# Patient Record
Sex: Male | Born: 1955 | Race: White | Hispanic: No | Marital: Single | Smoking: Never smoker
Health system: Southern US, Community
[De-identification: ages and names within clinical notes are randomized; demographics above are authoritative.]

## PROBLEM LIST (undated history)

## (undated) DIAGNOSIS — I480 Paroxysmal atrial fibrillation: Secondary | ICD-10-CM

## (undated) DIAGNOSIS — G4733 Obstructive sleep apnea (adult) (pediatric): Principal | ICD-10-CM

## (undated) HISTORY — DX: Paroxysmal atrial fibrillation: I48.0

## (undated) HISTORY — DX: Obstructive sleep apnea (adult) (pediatric): G47.33

## (undated) HISTORY — PX: HERNIA REPAIR: SHX51

## (undated) HISTORY — PX: NASAL SEPTUM SURGERY: SHX37

---

## 2014-05-24 ENCOUNTER — Emergency Department (HOSPITAL_COMMUNITY): Payer: BLUE CROSS/BLUE SHIELD

## 2014-05-24 ENCOUNTER — Emergency Department (HOSPITAL_COMMUNITY)
Admission: EM | Admit: 2014-05-24 | Discharge: 2014-05-24 | Disposition: A | Discharge status: Home / Self Care | Payer: BLUE CROSS/BLUE SHIELD | Attending: Emergency Medicine | Admitting: Emergency Medicine

## 2014-05-24 ENCOUNTER — Encounter (HOSPITAL_COMMUNITY): Payer: Self-pay | Admitting: Emergency Medicine

## 2014-05-24 DIAGNOSIS — R002 Palpitations: Secondary | ICD-10-CM | POA: Diagnosis present

## 2014-05-24 DIAGNOSIS — I4891 Unspecified atrial fibrillation: Secondary | ICD-10-CM | POA: Insufficient documentation

## 2014-05-24 DIAGNOSIS — Z7982 Long term (current) use of aspirin: Secondary | ICD-10-CM | POA: Diagnosis not present

## 2014-05-24 DIAGNOSIS — Z79899 Other long term (current) drug therapy: Secondary | ICD-10-CM | POA: Insufficient documentation

## 2014-05-24 LAB — CBC
HEMATOCRIT: 45.1 % (ref 39.0–52.0)
HEMOGLOBIN: 15.7 g/dL (ref 13.0–17.0)
MCH: 30.3 pg (ref 26.0–34.0)
MCHC: 34.8 g/dL (ref 30.0–36.0)
MCV: 86.9 fL (ref 78.0–100.0)
Platelets: 241 10*3/uL (ref 150–400)
RBC: 5.19 MIL/uL (ref 4.22–5.81)
RDW: 12.6 % (ref 11.5–15.5)
WBC: 9.1 10*3/uL (ref 4.0–10.5)

## 2014-05-24 LAB — BASIC METABOLIC PANEL
Anion gap: 7 (ref 5–15)
BUN: 19 mg/dL (ref 6–23)
CALCIUM: 10 mg/dL (ref 8.4–10.5)
CO2: 27 mmol/L (ref 19–32)
CREATININE: 0.88 mg/dL (ref 0.50–1.35)
Chloride: 106 mmol/L (ref 96–112)
Glucose, Bld: 132 mg/dL — ABNORMAL HIGH (ref 70–99)
POTASSIUM: 4.1 mmol/L (ref 3.5–5.1)
Sodium: 140 mmol/L (ref 135–145)

## 2014-05-24 LAB — I-STAT TROPONIN, ED: TROPONIN I, POC: 0.06 ng/mL (ref 0.00–0.08)

## 2014-05-24 LAB — MAGNESIUM: Magnesium: 2 mg/dL (ref 1.5–2.5)

## 2014-05-24 LAB — RAPID URINE DRUG SCREEN, HOSP PERFORMED
Amphetamines: NOT DETECTED
Barbiturates: NOT DETECTED
Benzodiazepines: NOT DETECTED
Cocaine: NOT DETECTED
OPIATES: NOT DETECTED
TETRAHYDROCANNABINOL: NOT DETECTED

## 2014-05-24 LAB — TSH: TSH: 1.711 u[IU]/mL (ref 0.350–4.500)

## 2014-05-24 MED ORDER — FLECAINIDE ACETATE 100 MG PO TABS: 200.0000 mg | ORAL_TABLET | ORAL | Status: DC | PRN

## 2014-05-24 MED ORDER — ASPIRIN 81 MG PO CHEW
324.0000 mg | CHEWABLE_TABLET | Freq: Once | ORAL | Status: AC
Start: 2014-05-24 — End: 2014-05-24
  Administered 2014-05-24: 324 mg via ORAL
  Filled 2014-05-24: qty 4

## 2014-05-24 MED ORDER — DILTIAZEM HCL ER COATED BEADS 120 MG PO CP24: 120.0000 mg | ORAL_CAPSULE | Freq: Every day | ORAL | Status: DC

## 2014-05-24 MED ORDER — DILTIAZEM HCL 100 MG IV SOLR
5.0000 mg/h | Freq: Once | INTRAVENOUS | Status: DC
  Filled 2014-05-24: qty 100

## 2014-05-24 MED ORDER — FLECAINIDE ACETATE 100 MG PO TABS
300.0000 mg | ORAL_TABLET | Freq: Once | ORAL | Status: AC
  Administered 2014-05-24: 300 mg via ORAL
  Filled 2014-05-24: qty 3

## 2014-05-24 MED ORDER — DILTIAZEM HCL ER COATED BEADS 120 MG PO CP24
120.0000 mg | ORAL_CAPSULE | Freq: Once | ORAL | Status: AC
  Administered 2014-05-24: 120 mg via ORAL
  Filled 2014-05-24: qty 1

## 2014-05-24 MED ORDER — SODIUM CHLORIDE 0.9 % IV BOLUS (SEPSIS)
1000.0000 mL | Freq: Once | INTRAVENOUS | Status: AC
  Administered 2014-05-24: 1000 mL via INTRAVENOUS

## 2014-05-24 NOTE — ED Notes (Signed)
He remains in nsr without ectopy; feels well and is d/c'd. Without incident.

## 2014-05-24 NOTE — Discharge Instructions (Signed)
Please start diltiazem 100 mg every day. Start taking aspirin every day - baby aspirin. Take flecainide 200 mg only if you go into fast heart rate, and if you don't respond within 2 hours, come back to the ER. If you go into afib and with that you have chest pain, shortness of breath or you faint - come back to the ER right away. Cardiology team will contact you for a follow up.  Atrial Fibrillation Atrial fibrillation is a type of irregular heart rhythm (arrhythmia). During atrial fibrillation, the upper chambers of the heart (atria) quiver continuously in a chaotic pattern. This causes an irregular and often rapid heart rate.  Atrial fibrillation is the result of the heart becoming overloaded with disorganized signals that tell it to beat. These signals are normally released one at a time by a part of the right atrium called the sinoatrial node. They then travel from the atria to the lower chambers of the heart (ventricles), causing the atria and ventricles to contract and pump blood as they pass. In atrial fibrillation, parts of the atria outside of the sinoatrial node also release these signals. This results in two problems. First, the atria receive so many signals that they do not have time to fully contract. Second, the ventricles, which can only receive one signal at a time, beat irregularly and out of rhythm with the atria.  There are three types of atrial fibrillation:   Paroxysmal. Paroxysmal atrial fibrillation starts suddenly and stops on its own within a week.  Persistent. Persistent atrial fibrillation lasts for more than a week. It may stop on its own or with treatment.  Permanent. Permanent atrial fibrillation does not go away. Episodes of atrial fibrillation may lead to permanent atrial fibrillation. Atrial fibrillation can prevent your heart from pumping blood normally. It increases your risk of stroke and can lead to heart failure.  CAUSES   Heart conditions, including a heart  attack, heart failure, coronary artery disease, and heart valve conditions.   Inflammation of the sac that surrounds the heart (pericarditis).  Blockage of an artery in the lungs (pulmonary embolism).  Pneumonia or other infections.  Chronic lung disease.  Thyroid problems, especially if the thyroid is overactive (hyperthyroidism).  Caffeine, excessive alcohol use, and use of some illegal drugs.   Use of some medicines, including certain decongestants and diet pills.  Heart surgery.   Birth defects.  Sometimes, no cause can be found. When this happens, the atrial fibrillation is called lone atrial fibrillation. The risk of complications from atrial fibrillation increases if you have lone atrial fibrillation and you are age 60 years or older. RISK FACTORS  Heart failure.  Coronary artery disease.  Diabetes mellitus.   High blood pressure (hypertension).   Obesity.   Other arrhythmias.   Increased age. SIGNS AND SYMPTOMS   A feeling that your heart is beating rapidly or irregularly.   A feeling of discomfort or pain in your chest.   Shortness of breath.   Sudden light-headedness or weakness.   Getting tired easily when exercising.   Urinating more often than normal (mainly when atrial fibrillation first begins).  In paroxysmal atrial fibrillation, symptoms may start and suddenly stop. DIAGNOSIS  Your health care provider may be able to detect atrial fibrillation when taking your pulse. Your health care provider may have you take a test called an ambulatory electrocardiogram (ECG). An ECG records your heartbeat patterns over a 24-hour period. You may also have other tests, such as:  Transthoracic echocardiogram (TTE). During echocardiography, sound waves are used to evaluate how blood flows through your heart.  Transesophageal echocardiogram (TEE).  Stress test. There is more than one type of stress test. If a stress test is needed, ask your health  care provider about which type is best for you.  Chest X-ray exam.  Blood tests.  Computed tomography (CT). TREATMENT  Treatment may include:  Treating any underlying conditions. For example, if you have an overactive thyroid, treating the condition may correct atrial fibrillation.  Taking medicine. Medicines may be given to control a rapid heart rate or to prevent blood clots, heart failure, or a stroke.  Having a procedure to correct the rhythm of the heart:  Electrical cardioversion. During electrical cardioversion, a controlled, low-energy shock is delivered to the heart through your skin. If you have chest pain, very low blood pressure, or sudden heart failure, this procedure may need to be done as an emergency.  Catheter ablation. During this procedure, heart tissues that send the signals that cause atrial fibrillation are destroyed.  Surgical ablation. During this surgery, thin lines of heart tissue that carry the abnormal signals are destroyed. This procedure can either be an open-heart surgery or a minimally invasive surgery. With the minimally invasive surgery, small cuts are made to access the heart instead of a large opening.  Pulmonary venous isolation. During this surgery, tissue around the veins that carry blood from the lungs (pulmonary veins) is destroyed. This tissue is thought to carry the abnormal signals. HOME CARE INSTRUCTIONS   Take medicines only as directed by your health care provider. Some medicines can make atrial fibrillation worse or recur.  If blood thinners were prescribed by your health care provider, take them exactly as directed. Too much blood-thinning medicine can cause bleeding. If you take too little, you will not have the needed protection against stroke and other problems.  Perform blood tests at home if directed by your health care provider. Perform blood tests exactly as directed.  Quit smoking if you smoke.  Do not drink alcohol.  Do not  drink caffeinated beverages such as coffee, soda, and some teas. You may drink decaffeinated coffee, soda, or tea.   Maintain a healthy weight.Do not use diet pills unless your health care provider approves. They may make heart problems worse.   Follow diet instructions as directed by your health care provider.  Exercise regularly as directed by your health care provider.  Keep all follow-up visits as directed by your health care provider. This is important. PREVENTION  The following substances can cause atrial fibrillation to recur:   Caffeinated beverages.  Alcohol.  Certain medicines, especially those used for breathing problems.  Certain herbs and herbal medicines, such as those containing ephedra or ginseng.  Illegal drugs, such as cocaine and amphetamines. Sometimes medicines are given to prevent atrial fibrillation from recurring. Proper treatment of any underlying condition is also important in helping prevent recurrence.  SEEK MEDICAL CARE IF:  You notice a change in the rate, rhythm, or strength of your heartbeat.  You suddenly begin urinating more frequently.  You tire more easily when exerting yourself or exercising. SEEK IMMEDIATE MEDICAL CARE IF:   You have chest pain, abdominal pain, sweating, or weakness.  You feel nauseous.  You have shortness of breath.  You suddenly have swollen feet and ankles.  You feel dizzy.  Your face or limbs feel numb or weak.  You have a change in your vision or speech. MAKE SURE  YOU:   Understand these instructions.  Will watch your condition.  Will get help right away if you are not doing well or get worse. Document Released: 04/04/2005 Document Revised: 08/19/2013 Document Reviewed: 05/15/2012 [facility] Patient Information 2015 [place], [place]. This information is not intended to replace advice given to you by your health care provider. Make sure you discuss any questions you have with your health care provider.

## 2014-05-24 NOTE — ED Notes (Signed)
He remains in no distress; and also remains in a fib.  We did not initiate diltiazem therapy per instruction of Dr. Elise BenneNanivati d/t heart rate decrease.

## 2014-05-24 NOTE — ED Provider Notes (Addendum)
CSN: [id number]     Arrival date & time 05/24/14  1205 History   First MD Initiated Contact with Patient 05/24/14 1216     Chief Complaint  Patient presents with  . Palpitations     (Consider location/radiation/quality/duration/timing/severity/associated sxs/prior Treatment) HPI Comments: Pt comes in with cc of palpitations x 6 months. Pt has no medical hx. Reports that he has been having some palpitations, off and on for the past 6 months. He used to drink 1or 2 servings of alcohol every night, he stopped using them, and his symptoms resolved for the past 2 months. When he had palpitations, they occurred in the middle of the night, and lasted for few seconds/minutes. Today, while patient was running, he started having palpitations 8 mile into his run. Pt has no chest pain, dizziness, dib. He denies any illicit drug use (did have a couple of drinks last night), any stimulant use, OTC meds. Pt is taking baby ASA every night for the past several months. Pt has no hx of PE, DVT and denies any exogenous estrogen use, long distance travels or surgery in the past 6 weeks, active cancer, recent immobilization.  ROS 10 Systems reviewed and are negative for acute change except as noted in the HPI.     Patient is a 59 y.o. male presenting with palpitations. The history is provided by the patient.  Palpitations Associated symptoms: no shortness of breath     History reviewed. No pertinent past medical history. Past Surgical History  Procedure Laterality Date  . Hernia repair     History reviewed. No pertinent family history. History  Substance Use Topics  . Smoking status: Never Smoker   . Smokeless tobacco: Not on file  . Alcohol Use: Yes     Comment: occasionally    Review of Systems  Respiratory: Negative for shortness of breath.   Cardiovascular: Positive for palpitations.  Neurological: Negative for syncope.  All other systems reviewed and are negative.     Allergies  Review  of patient's allergies indicates no known allergies.  Home Medications   Prior to Admission medications   Medication Sig Start Date End Date Taking? Authorizing Provider  aspirin 81 MG chewable tablet Chew 81 mg by mouth daily.   Yes Historical Provider, MD  cholecalciferol (VITAMIN D) 1000 UNITS tablet Take 4,000 Units by mouth daily.   Yes Historical Provider, MD  Magnesium 250 MG TABS Take 250 mg by mouth daily as needed. Muscle cramps   Yes Historical Provider, MD  pseudoephedrine (SUDAFED) 30 MG tablet Take 30 mg by mouth every 4 (four) hours as needed for congestion.   Yes Historical Provider, MD   BP 127/70 mmHg  Pulse 98  Temp(Src) 97.8 F (36.6 C) (Oral)  Resp 23  SpO2 99% Physical Exam  Constitutional: He is oriented to person, place, and time. He appears well-developed.  HENT:  Head: Normocephalic and atraumatic.  Eyes: Conjunctivae and EOM are normal. Pupils are equal, round, and reactive to light.  Neck: Normal range of motion. Neck supple.  Cardiovascular:  irregularly irregular with tachycardia  Pulmonary/Chest: Effort normal and breath sounds normal.  Abdominal: Soft. Bowel sounds are normal. He exhibits no distension. There is no tenderness. There is no rebound and no guarding.  Neurological: He is alert and oriented to person, place, and time.  Skin: Skin is warm.  Nursing note and vitals reviewed.   ED Course  Procedures (including critical care time) Labs Review Labs Reviewed  CBC  BASIC METABOLIC  PANEL  TSH  URINE RAPID DRUG SCREEN (HOSP PERFORMED)  MAGNESIUM  I-STAT TROPOININ, ED    Imaging Review No results found.   EKG Interpretation   Date/Time:  Saturday May 24 2014 12:12:18 EST Ventricular Rate:  107 PR Interval:    QRS Duration: 89 QT Interval:  331 QTC Calculation: 442 R Axis:   -14 Text Interpretation:  Atrial fibrillation RSR' in V1 or V2, probably  normal variant Nonspecific T abnormalities, anterior leads No old tracing   to compare Confirmed by Rhunette CroftNANAVATI, MD, Kelee Cunningham [phone] on 05/24/2014 12:18:14  PM         EKG Interpretation  Date/Time:  Saturday May 24 2014 15:05:32 EST Ventricular Rate:  75 PR Interval:  225 QRS Duration: 102 QT Interval:  380 QTC Calculation: 424 R Axis:   -11 Text Interpretation:  Sinus rhythm Prolonged PR interval Probable left atrial enlargement RSR' in V1 or V2, right VCD or RVH Borderline ST elevation, lateral leads converted to sinus rhythm - post flecainide Confirmed by Rhunette CroftNANAVATI, MD, Janey GentaANKIT [phone]([id number]) on 05/24/2014 3:15:51 PM        MDM   Final diagnoses:  Atrial fibrillation with RVR    Pt comes in with new onset Afib, and while i was assessing him, his HR was in the 130s.  CHADSVASV score is 0  Age in Years<65 - 0  Sex Male - 0 Congestive Heart Failure History No - 0 Hypertension History No - 0 Stroke/TIA/Thromboembolism History No - 0 Vascular Disease History No - 0 Diabetes Mellitus No - 0  Pt will get full dose ASA and since i think pt would be a good candidate for cardioversion and outpatient management, i will get Cards team involved in his care from the offset.  1:16 PM Spoke with Cards team. They agree that cardioversion is good idea for this patient. Recommend : Dilt drip for rate control and oral flecainide 300 mg.  Discussed the risk and benefits of chemical cardioversion with the patient, and he will be moved to resuscitation area for close monitoring.  Flecainide should have a response within 2 hours, if not, we will consider Electric Cardioversion.  Derwood KaplanAnkit Karris Deangelo, MD 05/24/14 1318  3:22 PM Back in NSR.  Derwood KaplanAnkit Lavana Huckeba, MD 05/24/14 1522  4:22 PM Dr. Shirlee LatchMclean recommending starting pt on diltiazem 120 mg q daily,providing rx for flecainide prn 200 mg. Cards will provide f/u. Return precautions discussed. All questions answered satisfactorily.  CRITICAL CARE Performed by: Derwood KaplanNanavati, Ramandeep Arington   Total critical care time: 95  min  Critical care time was exclusive of separately billable procedures and treating other patients.  Critical care was necessary to treat or prevent imminent or life-threatening deterioration.  Critical care was time spent personally by me on the following activities: development of treatment plan with patient and/or surrogate as well as nursing, discussions with consultants, evaluation of patient's response to treatment, examination of patient, obtaining history from patient or surrogate, ordering and performing treatments and interventions, ordering and review of laboratory studies, ordering and review of radiographic studies, pulse oximetry and re-evaluation of patient's condition.   Derwood KaplanAnkit Jeoffrey Eleazer, MD 05/24/14 [phone]

## 2014-05-24 NOTE — ED Notes (Signed)
Pt states that he has intermittent palpitations x 6 months.  States that he originally blamed it on the occasional glass of wine but states that they continue to persist after he cut out alcohol.  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  Denies pain.

## 2014-05-24 NOTE — ED Notes (Signed)
He has, and continues to feel "just fine".  His significant other is with him; and they are quite pleased with their care.

## 2014-05-27 ENCOUNTER — Ambulatory Visit (INDEPENDENT_AMBULATORY_CARE_PROVIDER_SITE_OTHER): Payer: BLUE CROSS/BLUE SHIELD | Admitting: Cardiology

## 2014-05-27 ENCOUNTER — Encounter: Payer: Self-pay | Admitting: Cardiology

## 2014-05-27 VITALS — BP 112/78 | HR 45 | Ht 69.0 in | Wt 162.0 lb

## 2014-05-27 DIAGNOSIS — I480 Paroxysmal atrial fibrillation: Secondary | ICD-10-CM

## 2014-05-27 MED ORDER — DILTIAZEM HCL ER COATED BEADS 120 MG PO CP24: 120.0000 mg | ORAL_CAPSULE | Freq: Every day | ORAL | Status: DC

## 2014-05-27 NOTE — Patient Instructions (Addendum)
Your physician has requested that you have an echocardiogram. Echocardiography is a painless test that uses sound waves to create images of your heart. It provides your doctor with information about the size and shape of your heart and how well your heart's chambers and valves are working. This procedure takes approximately one hour. There are no restrictions for this procedure.  Your physician has requested that you have an exercise tolerance test. For further information please visit [email or web address]. Please also follow instruction sheet, as given.  Your physician has recommended that you have a sleep study. This test records several body functions during sleep, including: brain activity, eye movement, oxygen and carbon dioxide blood levels, heart rate and rhythm, breathing rate and rhythm, the flow of air through your mouth and nose, snoring, body muscle movements, and chest and belly movement. FOR DR TURNER TO READ  Your physician wants you to follow-up in: 6 month ov/ekg You will receive a reminder letter in the mail two months in advance. If you don't receive a letter, please call our office to schedule the follow-up appointment.

## 2014-05-27 NOTE — Progress Notes (Signed)
Vicente MalesJohn Smithers Date of Birth:  12/26/1955 [facility] [address] [place], [place]  [id number] [phone]        Fax   [phone]   History of Present Illness: This pleasant 59 year old gentleman is seen by me for the first time today.  [facility].  The patient does not have a PCP yet.  [place]  He has been in overall very good health.  [facility] on Saturday, February 6 a cousin of paroxysmal atrial fibrillation.  This was his first documented episode of atrial fibrillation.  He had had a previous history of nocturnal palpitations dating back 8-9 months.  When these initially occurred he was consuming moderate amounts of caffeine as well as some wine.  The palpitations improved after he cut back on caffeine and improved further when he stopped drinking more wine.  The night prior to the onset of his arrhythmia he did have one glass of wine.  Patient is a serious runner.  He has run in about 30 marathon's.  He is busy training for another marathon.  He was running when he developed shortness of breath and felt like he had "hit the wall after only 6 miles or so.  He had planned to run 13 miles that day.  He went to the emergency room where he was found to be in atrial fibrillation with rapid ventricular response.  He was placed on IV Cardizem drip and was given flecainide by mouth.  Within several hours he had converted to sinus rhythm.  He has had no recurrent symptoms. The patient does not give any history of exertional chest pain.  He has never had exertional dizziness or syncope.  Prior to last weekend the patient was on no medication whatsoever.  He was discharged from the emergency room on baby aspirin once a day, diltiazem CD 120 mg daily, and he has flecainide 100 mg tablets to be used in a dose of 200 mg stat one time if he goes into atrial fibrillation. The patient  has been in good health with no serious previous medical problems.  He does snore and wonders if he might have sleep apnea. His family history reveals that his mother is living at age 60 and has chronic atrial fibrillation and is on warfarin.  His father died at 1379 of cancer of the lung and was a previous smoker.  Current Outpatient Prescriptions  Medication Sig Dispense Refill  . aspirin 81 MG chewable tablet Chew 81 mg by mouth daily.    . cholecalciferol (VITAMIN D) 1000 UNITS tablet Take 4,000 Units by mouth daily.    Marland Kitchen. diltiazem (CARDIZEM CD) 120 MG 24 hr capsule Take 1 capsule (120 mg total) by mouth daily. 90 capsule 3  . flecainide (TAMBOCOR) 100 MG tablet Take 2 tablets (200 mg total) by mouth as needed (only 1 time dose, and only if you go into atrial fibrillation/palpitations). 10 tablet 0   No current facility-administered medications for this visit.    No Known Allergies  Patient Active Problem List   Diagnosis Date Noted  . PAF (paroxysmal atrial fibrillation) 05/27/2014    History  Smoking status  . Never Smoker   Smokeless tobacco  . Not on file    History  Alcohol Use  . Yes    Comment: occasionally    History reviewed. No pertinent family history.  Review of Systems: Constitutional: no fever chills diaphoresis or fatigue or change in weight.  Head and neck: no hearing loss, no epistaxis, no photophobia or visual disturbance. Respiratory: No cough, shortness of breath or wheezing. Cardiovascular: No chest pain peripheral edema, palpitations. Gastrointestinal: No abdominal distention, no abdominal pain, no change in bowel habits hematochezia or melena. Genitourinary: No dysuria, no frequency, no urgency, no nocturia. Musculoskeletal:No arthralgias, no back pain, no gait disturbance or myalgias. Neurological: No dizziness, no headaches, no numbness, no seizures, no syncope, no weakness, no tremors. Hematologic: No lymphadenopathy, no easy  bruising. Psychiatric: No confusion, no hallucinations, no sleep disturbance.   Wt Readings from Last 3 Encounters:  05/27/14 162 lb (73.483 kg)    Physical Exam: Filed Vitals:   05/27/14 1513  BP: 112/78  Pulse: 45  The patient appears to be in no distress.  Head and neck exam reveals that the pupils are equal and reactive.  The extraocular movements are full.  There is no scleral icterus.  Mouth and pharynx are benign.  No lymphadenopathy.  No carotid bruits.  The jugular venous pressure is normal.  Thyroid is not enlarged or tender.  Chest is clear to percussion and auscultation.  No rales or rhonchi.  Expansion of the chest is symmetrical.  Heart reveals no abnormal lift or heave.  First and second heart sounds are normal.  There is no murmur gallop rub or click.  The abdomen is soft and nontender.  Bowel sounds are normoactive.  There is no hepatosplenomegaly or mass.  There are no abdominal bruits.  Extremities reveal no phlebitis or edema.  Pedal pulses are good.  There is no cyanosis or clubbing.  Neurologic exam is normal strength and no lateralizing weakness.  No sensory deficits.  Integument reveals no rash  EKG today shows marked sinus bradycardia at 45 bpm.  There is a pattern of right ventricular conduction delay.  PR interval is upper normal at 206 and the QTc interval is 385.   Assessment / Plan: 1.  Paroxysmal atrial fibrillation.  Chads vasc score is 0. 2.  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 3.  History of snoring.  Rule out obstructive sleep apnea.  Disposition: We are going to have him return for a two-dimensional echocardiogram. We will have him return for a Bruce protocol treadmill stress test.  This will be reassuring prior to his running more marathons. Continue current medication.  Recheck in 6 months for follow-up office visit and EKG We will refer to the sleep lab for a sleep study

## 2014-05-30 ENCOUNTER — Ambulatory Visit (HOSPITAL_COMMUNITY): Payer: BLUE CROSS/BLUE SHIELD | Attending: Cardiovascular Disease

## 2014-05-30 ENCOUNTER — Encounter: Payer: Self-pay | Admitting: Nurse Practitioner

## 2014-05-30 ENCOUNTER — Ambulatory Visit (INDEPENDENT_AMBULATORY_CARE_PROVIDER_SITE_OTHER): Payer: BLUE CROSS/BLUE SHIELD | Admitting: Nurse Practitioner

## 2014-05-30 DIAGNOSIS — I480 Paroxysmal atrial fibrillation: Secondary | ICD-10-CM | POA: Diagnosis not present

## 2014-05-30 NOTE — Progress Notes (Signed)
2D Echo completed. 05/30/2014 

## 2014-05-30 NOTE — Progress Notes (Signed)
Exercise Treadmill Test  Pre-Exercise Testing Evaluation Rhythm: sinus bradycardia  Rate: 57 bpm     Test  Exercise Tolerance Test Ordering MD: Cassell Clementhomas Brackbill, MD  Interpreting MD: Norma FredricksonLori Gerhardt, NP  Unique Test No: 1  Treadmill:  1  Indication for ETT: Afib  Contraindication to ETT: No   Stress Modality: exercise - treadmill  Cardiac Imaging Performed: non   Protocol: standard Bruce - maximal  Max BP:  222/61  Max MPHR (bpm):  161 85% MPR (bpm):  137  MPHR obtained (bpm):  157 % MPHR obtained:  97%  Reached 85% MPHR (min:sec):  12:54 Total Exercise Time (min-sec):  15 minutes  Workload in METS:  17.2 Borg Scale: 17  Reason ETT Terminated:  desired heart rate attained    ST Segment Analysis At Rest: normal ST segments - no evidence of significant ST depression With Exercise: no evidence of significant ST depression  Other Information Arrhythmia:  No Angina during ETT:  absent (0) Quality of ETT:  diagnostic  ETT Interpretation:  normal - no evidence of ischemia by ST analysis  Comments: Patient presents today for routine GXT. Has had PAF. He is a marathon runner. No chest pain. Currently doing well without symptoms. Today the patient exercised on the standard Bruce protocol for a total of 15 minutes.  Excellent exercise tolerance.  Adequate blood pressure response.  Clinically negative for chest pain. Test was stopped due to achievement of target HR.  EKG negative for ischemia. No significant arrhythmia noted.   Recommendations: CV risk factor modification  See back as planned.  Patient is agreeable to this plan and will call if any problems develop in the interim.   Rosalio MacadamiaLori C. Gerhardt, RN, ANP-C [facility] [address] [place], [place]  [address] [phone]

## 2014-07-09 ENCOUNTER — Other Ambulatory Visit: Payer: Self-pay | Admitting: *Deleted

## 2014-07-09 MED ORDER — DILTIAZEM HCL ER COATED BEADS 120 MG PO CP24: 120.0000 mg | ORAL_CAPSULE | Freq: Every day | ORAL | Status: DC

## 2014-08-05 ENCOUNTER — Ambulatory Visit (HOSPITAL_BASED_OUTPATIENT_CLINIC_OR_DEPARTMENT_OTHER): Payer: BLUE CROSS/BLUE SHIELD | Attending: Cardiology | Admitting: Radiology

## 2014-08-05 VITALS — Ht 69.0 in | Wt 160.0 lb

## 2014-08-05 DIAGNOSIS — R0683 Snoring: Secondary | ICD-10-CM | POA: Diagnosis not present

## 2014-08-05 DIAGNOSIS — G4733 Obstructive sleep apnea (adult) (pediatric): Secondary | ICD-10-CM | POA: Insufficient documentation

## 2014-08-05 DIAGNOSIS — I480 Paroxysmal atrial fibrillation: Secondary | ICD-10-CM | POA: Diagnosis not present

## 2014-08-05 DIAGNOSIS — R001 Bradycardia, unspecified: Secondary | ICD-10-CM | POA: Diagnosis not present

## 2014-08-05 DIAGNOSIS — G4730 Sleep apnea, unspecified: Secondary | ICD-10-CM

## 2014-08-05 DIAGNOSIS — R0681 Apnea, not elsewhere classified: Secondary | ICD-10-CM | POA: Diagnosis present

## 2014-08-05 DIAGNOSIS — G4719 Other hypersomnia: Secondary | ICD-10-CM

## 2014-08-07 NOTE — Sleep Study (Signed)
   NAME: Robert MalesJohn [name] DATE OF BIRTH:  Apr 22, 1955 MEDICAL RECORD NUMBER [id number]  LOCATION: [facility]  PHYSICIAN: TURNER,TRACI R  DATE OF STUDY: 08/05/2014  SLEEP STUDY TYPE: Nocturnal Polysomnogram               REFERRING PHYSICIAN: Cassell ClementBrackbill, Thomas, MD  INDICATION FOR STUDY: witnessed apnea and snoring  EPWORTH SLEEPINESS SCORE: 4 HEIGHT: 5\' 9"  (175.3 cm)  WEIGHT: 160 lb (72.576 kg)    Body mass index is 23.62 kg/(m^2).  NECK SIZE: 14 in.  MEDICATIONS: Reviewed in the chart  SLEEP ARCHITECTURE: The patient slept for a total of 272 minutes out of a total sleep period time of 363 minutes.  There was 2 minutes of slow wave slave and 40 minutes of REM sleep. The onset to sleep latency was delayed at 18 minutes and onset to REM sleep was slightly delayed.  The sleep efficiency was reduced at 71%.  There was an increased number of spontaneous arousals with an index of 27 per hour.  There were no significant arousals due to sleep disordered breathing.    RESPIRATORY DATA: There were 3 cental apneas and 30 hypopneas.  The AHI was 7.3 events per hour consistent with mild obstructive sleep apnea/hypopnea syndrome.  Most events occurred during NREM sleep and in the supine position. There was mild snoring.    OXYGEN DATA: The average oxygen saturation was 94% and the lowest oxygen saturation was 82%.    CARDIAC DATA: The patient maintained sinus bradycardia with PAC's.  The average heart rate was 43 bpm and the lowest heart rate was 33 bpm.    MOVEMENT/PARASOMNIA: There were no periodic limb movement disorders and no REM sleep behavior disorders  IMPRESSION/ RECOMMENDATION:   1.  Very mild obstructive sleep apnea/hypopnea syndrome with an AHI of 7.3 events per hour.  Most events occurred during NREM sleep and in the supine position. There was mild snoring.   2.  Reduced sleep efficiency with increased frequency of arousals mainly from spontaneous arousals and not from  sleep disordered breathing. 3.  Abnormal sleep architecture with reduced REM and slow wave sleep. 4.  Transient  oxygen desaturations associated with respiratory events as low as 82%.   5.  Sinus bradycardia as low as 33bpm during sleep with PAC's. 6.  Mild snoring was noted. 7.  Given very mild degree of OSA, treatment options include referral to ENT to evaluate for surgical causes of mild OSA and snoring, referral to dentistry for oral device or a trial of CPAP therapy.    Quintella ReichertURNER,TRACI R Diplomate, American Board of Sleep Medicine  ELECTRONICALLY SIGNED ON:  08/07/2014, 7:03 PM [facility] PH: [phone]   FX: [phone] ACCREDITED BY THE AMERICAN ACADEMY OF SLEEP MEDICINE

## 2014-08-08 ENCOUNTER — Telehealth: Payer: Self-pay | Admitting: Cardiology

## 2014-08-08 DIAGNOSIS — R0683 Snoring: Secondary | ICD-10-CM | POA: Insufficient documentation

## 2014-08-08 DIAGNOSIS — G4719 Other hypersomnia: Secondary | ICD-10-CM | POA: Insufficient documentation

## 2014-08-08 NOTE — Telephone Encounter (Signed)
Please let patient know that she has very mild OSA - please set up an appt with me to discuss treatment options

## 2014-08-11 NOTE — Telephone Encounter (Signed)
Left message to call back  

## 2014-08-11 NOTE — Telephone Encounter (Signed)
Patient is aware of information. Office visit scheduled for 5/31 1:45pm

## 2014-09-15 NOTE — Progress Notes (Addendum)
Cardiology Office Note   Date:  09/16/2014   ID:  Robert [name], DOB May 24, 1955, MRN [id number]  PCP:  No PCP Per Patient    Chief Complaint  Patient presents with  . Follow-up    OSA      History of Present Illness: Robert [name] is a 59 y.o. male who presents for evaluation of OSA.  He was recently see by Dr. Patty Sermons for atrial fibrillation and mentioned that he snored.   He has no excessive daytime sleepiness and feels refreshed in the am when he gets up.  He occasionally wakes himself up snoring but no gasping for air.   He underwent PSG showing very mild OSA with an AHI of 7.3 events per hour.  Most events occurred during NREM sleep in the supine position.  There was mild snoring.  He had transient oxygen desaturations to 82%.  He is now here to discuss options for treatment.    Past Medical History  Diagnosis Date  . PAF (paroxysmal atrial fibrillation)     Past Surgical History  Procedure Laterality Date  . Hernia repair    . Nasal septum surgery       Current Outpatient Prescriptions  Medication Sig Dispense Refill  . aspirin 81 MG chewable tablet Chew 81 mg by mouth daily.    . cholecalciferol (VITAMIN D) 1000 UNITS tablet Take 4,000 Units by mouth daily.    Marland Kitchen diltiazem (CARDIZEM CD) 120 MG 24 hr capsule Take 1 capsule (120 mg total) by mouth daily. 90 capsule 1  . flecainide (TAMBOCOR) 100 MG tablet Take 2 tablets (200 mg total) by mouth as needed (only 1 time dose, and only if you go into atrial fibrillation/palpitations). 10 tablet 0  . valACYclovir (VALTREX) 500 MG tablet Take 500 mg by mouth as needed. For break outs     No current facility-administered medications for this visit.    Allergies:   Review of patient's allergies indicates no known allergies.    Social History:  The patient  reports that he has never smoked. He does not have any smokeless tobacco history on file. He reports that he drinks alcohol. He reports that he  does not use illicit drugs.   Family History:  The patient's family history includes Atrial fibrillation in his mother; Kidney disease in his father; Lung cancer in his father.    ROS:  Please see the history of present illness.   Otherwise, review of systems are positive for none.   All other systems are reviewed and negative.    PHYSICAL EXAM: VS:  BP 120/74 mmHg  Pulse 58  Ht  (1.753 m)  Wt 161 lb 12.8 oz (73.392 kg)  BMI 23.88 kg/m2  SpO2 97% , BMI Body mass index is 23.88 kg/(m^2). GEN: Well nourished, well developed, in no acute distress HEENT: normal Neck: no JVD, carotid bruits, or masses Cardiac: RRR; no murmurs, rubs, or gallops,no edema  Respiratory:  clear to auscultation bilaterally, normal work of breathing GI: soft, nontender, nondistended, + BS MS: no deformity or atrophy Skin: warm and dry, no rash Neuro:  Strength and sensation are intact Psych: euthymic mood, full affect   EKG:  EKG is not ordered today.    Recent Labs: 05/24/2014: BUN 19; Creatinine 0.88; Hemoglobin 15.7; Magnesium 2.0; Platelets 241; Potassium 4.1; Sodium 140; TSH 1.711    Lipid Panel No results found for: CHOL,  TRIG, HDL, CHOLHDL, VLDL, LDLCALC, LDLDIRECT    Wt Readings from Last 3 Encounters:  09/16/14 161 lb 12.8 oz (73.392 kg)  08/05/14 160 lb (72.576 kg)  05/27/14 162 lb (73.483 kg)      ASSESSMENT AND PLAN:  1.  Snoring 2.  Mild Obstructive Sleep Apnea/Hypopnea Syndrome.  He has very mild sleep apnea with no significant daytime sleepiness.  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  I think we should refer to Dr. Lazarus SalinesWolicki to make sure he does not have any obstruction in his airway that is resulting in snoring and OSA.  If this is normal then I have recommended that we refer him to dentistry for fitting of an oral device.  Given the mild degree of sleep disordered breathing, I do not think we need to go straight to  CPAP unless the oral device does not correct the OSA.  His time spent with O2 sats <88% during the sleep study was 0.5 minutes. He will call once he has seen ENT and let me know if he needs any surgery and if not will refer to dentistry.  I have also encouraged him to try to avoid sleeping in the supine position since his events mainly occurred supine. 3.  PAF maintaining NSR.  Continue Cardizem/Flecainide   Current medicines are reviewed at length with the patient today.  The patient does not have concerns regarding medicines.  The following changes have been made:  no change  Labs/ tests ordered today: See above Assessment and Plan No orders of the defined types were placed in this encounter.     Disposition:   FU with me in 6 months  Signed, Quintella ReichertURNER,July Nickson R, MD  09/16/2014 2:00 PM    [facility] [address], [place], [place]  [address] Phone: [phone]; Fax: [phone]

## 2014-09-16 ENCOUNTER — Ambulatory Visit (INDEPENDENT_AMBULATORY_CARE_PROVIDER_SITE_OTHER): Payer: BLUE CROSS/BLUE SHIELD | Admitting: Cardiology

## 2014-09-16 ENCOUNTER — Encounter: Payer: Self-pay | Admitting: Cardiology

## 2014-09-16 VITALS — BP 120/74 | HR 58 | Ht 69.0 in | Wt 161.8 lb

## 2014-09-16 DIAGNOSIS — R0683 Snoring: Secondary | ICD-10-CM

## 2014-09-16 DIAGNOSIS — G4733 Obstructive sleep apnea (adult) (pediatric): Secondary | ICD-10-CM | POA: Diagnosis not present

## 2014-09-16 DIAGNOSIS — I480 Paroxysmal atrial fibrillation: Secondary | ICD-10-CM

## 2014-09-16 HISTORY — DX: Obstructive sleep apnea (adult) (pediatric): G47.33

## 2014-09-16 NOTE — Patient Instructions (Signed)
Medication Instructions:  Your physician recommends that you continue on your current medications as directed. Please refer to the Current Medication list given to you today.   Labwork: None  Testing/Procedures: None  Follow-Up: You have been referred to Dr. Lazarus SalinesWolicki for mild OSA with history of deviated septum.  Your physician wants you to follow-up in: 6 months with Dr. Mayford Knifeurner. You will receive a reminder letter in the mail two months in advance. If you don't receive a letter, please call our office to schedule the follow-up appointment.   Any Other Special Instructions Will Be Listed Below (If Applicable).

## 2014-09-26 ENCOUNTER — Telehealth: Payer: Self-pay | Admitting: Cardiology

## 2014-09-26 NOTE — Telephone Encounter (Signed)
Pt to call after seeing specialist -pls advise

## 2014-09-27 ENCOUNTER — Telehealth: Payer: Self-pay | Admitting: Cardiology

## 2014-09-27 NOTE — Telephone Encounter (Signed)
Received OV note from Dr. Lazarus Salines.  Please find out if patient improved using Afrin nasal spray with his snoring.

## 2014-09-29 NOTE — Telephone Encounter (Signed)
Please refer to Dr. Toni Arthurs for oral device for very mild OSA

## 2014-09-29 NOTE — Telephone Encounter (Signed)
Patient st he does feel a little improvement using the Afrin. He is interested in seeing Dr. Toni Arthurs for the dental piece, though. He st Dr. Lazarus Salines said he would be a good candidate for the oral device.

## 2014-09-29 NOTE — Telephone Encounter (Signed)
See other phone note

## 2014-09-29 NOTE — Telephone Encounter (Signed)
Informed patient that referral has been placed and Dr. Alveda Reasons office will be calling to schedule.  Patient grateful for callback.

## 2014-11-04 ENCOUNTER — Telehealth: Payer: Self-pay | Admitting: Cardiology

## 2014-11-24 ENCOUNTER — Other Ambulatory Visit: Payer: Self-pay | Admitting: Cardiology

## 2014-12-17 ENCOUNTER — Encounter: Payer: Self-pay | Admitting: Cardiovascular Disease

## 2014-12-17 ENCOUNTER — Ambulatory Visit (INDEPENDENT_AMBULATORY_CARE_PROVIDER_SITE_OTHER): Payer: BLUE CROSS/BLUE SHIELD | Admitting: Cardiovascular Disease

## 2014-12-17 ENCOUNTER — Other Ambulatory Visit: Payer: Self-pay | Admitting: *Deleted

## 2014-12-17 VITALS — BP 124/80 | HR 54 | Ht 69.0 in | Wt 159.0 lb

## 2014-12-17 DIAGNOSIS — I480 Paroxysmal atrial fibrillation: Secondary | ICD-10-CM

## 2014-12-17 DIAGNOSIS — Z79899 Other long term (current) drug therapy: Secondary | ICD-10-CM

## 2014-12-17 DIAGNOSIS — Z125 Encounter for screening for malignant neoplasm of prostate: Secondary | ICD-10-CM | POA: Diagnosis not present

## 2014-12-17 MED ORDER — DILTIAZEM HCL ER COATED BEADS 120 MG PO CP24: 120.0000 mg | ORAL_CAPSULE | Freq: Every day | ORAL | Status: DC

## 2014-12-17 NOTE — Assessment & Plan Note (Signed)
Mr. Robert [name] has a history of PAF on diltiazem and flecainide. He is followed by Dr. Patty Sermons.he relocated from [facility] to [place] one year ago. His CHA2DSVASC2 score is 0 and therefore he was not put on oral anticoagulation. His 2-D echo was essentially normal with normal systolic function, grade 2 diastolic dysfunction, moderate right and mild left atrial enlargement. His routine GXT was normal. He is a marathon runner and runs daily. He gets occasional episodes of PAF which are manifested by decreased exercise tolerance but this is a rare phenomenon. He also expresses episodes of rare palpitations with shortness of breath which sounds like PVCs. I reassured him. I do not think he is a candidate for ablation. He is being followed by Dr. Mayford Knife for sleep apnea as well.  Marland Kitchen

## 2014-12-17 NOTE — Progress Notes (Signed)
12/17/2014 [name]   08-13-1955  [id number]  Primary Physician No PCP Per Patient Primary Cardiologist: Runell Gess MD [name]   HPI:  Mr. Mentink a 59 year old well-appearing Caucasian male who relocated from [place] on 11/30/2013 to [place] to work at [employer]. He has no critical factors. He was seen by Dr. Patty Sermons on 05/27/14 for PAF. A GXT was normal. An echo showed normal LV systolic function, grade 2 diastolic dysfunction, moderate right and mild left atrial enlargement. His CHA2DSVASC2 score is  0 .he does get occasional episode of decreased exercise tolerance during long runs which he attributes to episodes of peak AF which have been documented in the past. He was not put on an oral anticoagulated because of his low score. He comes in today worried about his episodes of PAF and occasional  Symptoms of what sound like PVCs. He denies chest pain or shortness of breath.   Current Outpatient Prescriptions  Medication Sig Dispense Refill  . CARTIA XT 120 MG 24 hr capsule TAKE 1 CAPSULE DAILY 90 capsule 0  . flecainide (TAMBOCOR) 100 MG tablet Take 2 tablets (200 mg total) by mouth as needed (only 1 time dose, and only if you go into atrial fibrillation/palpitations). 10 tablet 0  . Multiple Vitamins-Minerals (PRESERVISION AREDS 2 PO) Take by mouth daily.    . valACYclovir (VALTREX) 500 MG tablet Take 500 mg by mouth as needed. For break outs     No current facility-administered medications for this visit.    No Known Allergies  Social History   Social History  . Marital Status: Single    Spouse Name: N/A  . Number of Children: N/A  . Years of Education: N/A   Occupational History  . Not on file.   Social History Main Topics  . Smoking status: Never Smoker   . Smokeless tobacco: Not on file  . Alcohol Use: Yes     Comment: occasionally  . Drug Use: No  . Sexual Activity: Yes   Other Topics Concern  . Not on file    Social History Narrative     Review of Systems: General: negative for chills, fever, night sweats or weight changes.  Cardiovascular: negative for chest pain, dyspnea on exertion, edema, orthopnea, palpitations, paroxysmal nocturnal dyspnea or shortness of breath Dermatological: negative for rash Respiratory: negative for cough or wheezing Urologic: negative for hematuria Abdominal: negative for nausea, vomiting, diarrhea, bright red blood per rectum, melena, or hematemesis Neurologic: negative for visual changes, syncope, or dizziness All other systems reviewed and are otherwise negative except as noted above.    Blood pressure 124/80, pulse 54, height 5\' 9"  (1.753 m), weight 159 lb (72.122 kg).  General appearance: alert and no distress Neck: no adenopathy, no carotid bruit, no JVD, supple, symmetrical, trachea midline and thyroid not enlarged, symmetric, no tenderness/mass/nodules Lungs: clear to auscultation bilaterally Heart: regular rate and rhythm, S1, S2 normal, no murmur, click, rub or gallop Extremities: extremities normal, atraumatic, no cyanosis or edema  EKG sinus bradycardia 54 with RSR prime in leads V1 consistent with an RV conduction delay. I personally reviewed this EKG  ASSESSMENT AND PLAN:   PAF (paroxysmal atrial fibrillation) Mr. Mccullars has a history of PAF on diltiazem and flecainide. He is followed by Dr. Patty Sermons.he relocated from [facility] to [place] one year ago. His CHA2DSVASC2 score is 0 and therefore he was not put on oral anticoagulation. His 2-D echo was essentially normal with normal  systolic function, grade 2 diastolic dysfunction, moderate right and mild left atrial enlargement. His routine GXT was normal. He is a marathon runner and runs daily. He gets occasional episodes of PAF which are manifested by decreased exercise tolerance but this is a rare phenomenon. He also expresses episodes of rare palpitations with shortness of  breath which sounds like PVCs. I reassured him. I do not think he is a candidate for ablation. He is being followed by Dr. Mayford Knife for sleep apnea as well.  Runell Gess MD [place], [facility] 12/17/2014 9:38 AM Runell Gess MD FACP,FACC,FAHA, [facility]

## 2014-12-17 NOTE — Patient Instructions (Signed)
Medication Instructions:  Continue your current medications  Labwork:  A FASTING lipid profile: to be done at your convenience.  There is a [employer] operational officer lab on the first floor of this building, [address].  They are open from 8am-5pm with a lunch from 12-2.  You do not need an appointment.    Testing/Procedures:  Cardiac CT for calcium scoring  Follow-Up:  6 months with Dr Patty Sermons  Any Other Special Instructions Will Be Listed Below (If Applicable).

## 2014-12-18 ENCOUNTER — Ambulatory Visit (INDEPENDENT_AMBULATORY_CARE_PROVIDER_SITE_OTHER)
Admission: RE | Admit: 2014-12-18 | Discharge: 2014-12-18 | Disposition: A | Discharge status: Home / Self Care | Payer: BLUE CROSS/BLUE SHIELD | Source: Ambulatory Visit | Attending: Cardiovascular Disease | Admitting: Cardiovascular Disease

## 2014-12-18 DIAGNOSIS — I480 Paroxysmal atrial fibrillation: Secondary | ICD-10-CM

## 2015-01-02 LAB — CBC
HCT: 41.9 % (ref 39.0–52.0)
Hemoglobin: 14.5 g/dL (ref 13.0–17.0)
MCH: 30.2 pg (ref 26.0–34.0)
MCHC: 34.6 g/dL (ref 30.0–36.0)
MCV: 87.3 fL (ref 78.0–100.0)
MPV: 11.1 fL (ref 8.6–12.4)
PLATELETS: 195 10*3/uL (ref 150–400)
RBC: 4.8 MIL/uL (ref 4.22–5.81)
RDW: 13.7 % (ref 11.5–15.5)
WBC: 4.2 10*3/uL (ref 4.0–10.5)

## 2015-01-02 LAB — COMPLETE METABOLIC PANEL WITH GFR
ALT: 21 U/L (ref 9–46)
AST: 21 U/L (ref 10–35)
Albumin: 4.4 g/dL (ref 3.6–5.1)
Alkaline Phosphatase: 60 U/L (ref 40–115)
BUN: 16 mg/dL (ref 7–25)
CHLORIDE: 104 mmol/L (ref 98–110)
CO2: 27 mmol/L (ref 20–31)
Calcium: 9.3 mg/dL (ref 8.6–10.3)
Creat: 0.74 mg/dL (ref 0.70–1.25)
Glucose, Bld: 87 mg/dL (ref 65–99)
Potassium: 4.6 mmol/L (ref 3.5–5.3)
SODIUM: 140 mmol/L (ref 135–146)
TOTAL PROTEIN: 6.3 g/dL (ref 6.1–8.1)
Total Bilirubin: 0.8 mg/dL (ref 0.2–1.2)

## 2015-01-02 LAB — LIPID PANEL
CHOLESTEROL: 170 mg/dL (ref 125–200)
HDL: 50 mg/dL (ref >=40)
LDL CALC: 110 mg/dL (ref <130)
TRIGLYCERIDES: 49 mg/dL (ref <150)
Total CHOL/HDL Ratio: 3.4 Ratio (ref <=5.0)
VLDL: 10 mg/dL (ref <30)

## 2015-01-02 LAB — PSA: PSA: 3.25 ng/mL (ref <=4.00)

## 2015-01-02 LAB — TSH: TSH: 2.103 u[IU]/mL (ref 0.350–4.500)

## 2015-01-06 ENCOUNTER — Telehealth: Payer: Self-pay | Admitting: Cardiovascular Disease

## 2015-01-06 NOTE — Telephone Encounter (Signed)
Spoke with pt, aware of lab results. Copy mailed to pt at his request

## 2015-01-06 NOTE — Telephone Encounter (Signed)
Pt returning call, he thinks it is concerning his lab results.

## 2015-01-07 NOTE — Addendum Note (Signed)
Addended by: Neta Ehlers on: 01/07/2015 01:55 PM   Modules accepted: Orders

## 2015-04-28 ENCOUNTER — Encounter: Payer: Self-pay | Admitting: Cardiology

## 2015-04-28 ENCOUNTER — Ambulatory Visit (INDEPENDENT_AMBULATORY_CARE_PROVIDER_SITE_OTHER): Payer: BLUE CROSS/BLUE SHIELD | Admitting: Cardiology

## 2015-04-28 VITALS — BP 116/86 | HR 80 | Ht 69.0 in | Wt 164.6 lb

## 2015-04-28 DIAGNOSIS — G4733 Obstructive sleep apnea (adult) (pediatric): Secondary | ICD-10-CM

## 2015-04-28 NOTE — Patient Instructions (Signed)

## 2015-04-28 NOTE — Progress Notes (Signed)
Cardiology Office Note   Date:  04/28/2015   ID:  Robert [name], DOB [birth year], MRN [id number]  PCP:  No PCP Per Patient    Chief Complaint  Patient presents with  . Sleep Apnea      History of Present Illness: Robert [name] is a 60 y.o. male who presents for followup of OSA.He underwent PSG showing very mild OSA with an AHI of 7.3 events per hour. Most events occurred during NREM sleep in the supine position. There was mild snoring. He had transient oxygen desaturations to 82%. He was referred to Dr. Lazarus Salines for ENT evaluation and nothing was recommended.  He was then referred to Dr. Toni Arthurs for fitting of oral device.  He is doing well with his device but its has had to be adjusted due to increased apneas.  He thinks he is snoring and also sleeping mainly on his back.     Past Medical History  Diagnosis Date  . PAF (paroxysmal atrial fibrillation) [facility])     Past Surgical History  Procedure Laterality Date  . Hernia repair    . Nasal septum surgery       Current Outpatient Prescriptions  Medication Sig Dispense Refill  . aspirin 81 MG tablet Take 81 mg by mouth daily.    Marland Kitchen diltiazem (CARTIA XT) 120 MG 24 hr capsule Take 1 capsule (120 mg total) by mouth daily. 90 capsule 3  . flecainide (TAMBOCOR) 100 MG tablet Take 2 tablets (200 mg total) by mouth as needed (only 1 time dose, and only if you go into atrial fibrillation/palpitations). 10 tablet 0  . Multiple Vitamins-Minerals (PRESERVISION AREDS 2 PO) Take 1 tablet by mouth daily.     . valACYclovir (VALTREX) 500 MG tablet Take 500 mg by mouth as needed. For break outs     No current facility-administered medications for this visit.    Allergies:   Review of patient's allergies indicates no known allergies.    Social History:  The patient  reports that he has never smoked. He does not have any smokeless tobacco history on file. He reports that he drinks alcohol. He reports that he does not use  illicit drugs.   Family History:  The patient's family history includes Atrial fibrillation in his mother; Kidney disease in his father; Lung cancer in his father.    ROS:  Please see the history of present illness.   Otherwise, review of systems are positive for none.   All other systems are reviewed and negative.    PHYSICAL EXAM: VS:  BP 116/86 mmHg  Pulse 80  Ht 5\' 9"  (1.753 m)  Wt 164 lb 9.6 oz (74.662 kg)  BMI 24.30 kg/m2 , BMI Body mass index is 24.3 kg/(m^2). GEN: Well nourished, well developed, in no acute distress HEENT: normal Neck: no JVD, carotid bruits, or masses Cardiac: RRR; no murmurs, rubs, or gallops,no edema  Respiratory:  clear to auscultation bilaterally, normal work of breathing GI: soft, nontender, nondistended, + BS MS: no deformity or atrophy Skin: warm and dry, no rash Neuro:  Strength and sensation are intact Psych: euthymic mood, full affect   EKG:  EKG is not ordered today.   Recent Labs: 05/24/2014: Magnesium 2.0 01/01/2015: ALT 21; BUN 16; Creat 0.74; Hemoglobin 14.5; Platelets 195; Potassium 4.6; Sodium 140; TSH 2.103    Lipid Panel    Component Value Date/Time   CHOL 170 01/01/2015  0001   TRIG 49 01/01/2015 0001   HDL 50 01/01/2015 0001   CHOLHDL 3.4 01/01/2015 0001   VLDL 10 01/01/2015 0001   LDLCALC 110 01/01/2015 0001      Wt Readings from Last 3 Encounters:  04/28/15 164 lb 9.6 oz (74.662 kg)  12/17/14 159 lb (72.122 kg)  09/16/14 161 lb 12.8 oz (73.392 kg)        ASSESSMENT AND PLAN:  1.  Mild OSA now using oral device.  His AHI was at 21.9 and device was adjusted but he cannot tolerate it and is going to have Dr. Toni ArthursFuller decrease it some.  AHI on adjusted setting was better at 10.7/hr but he cannot tolerate it. He is also snoring.  I suspect that he is sleeping mainly on his back.  I have recommended that he by a sleep pillow for apnea to prevent back sleeping and he is going to have Dr. Toni ArthursFuller adjust his device so he can  tolerate it better.  He will have her get another sleep study after he gets his sleep pillow. If AHI still in moderate range may need to consider CPAP.  He will drop study off at my office for review.     Current medicines are reviewed at length with the patient today.  The patient does not have concerns regarding medicines.  The following changes have been made:  no change  Labs/ tests ordered today: See above Assessment and Plan No orders of the defined types were placed in this encounter.     Disposition:   FU with me in 6 months  Signed, Quintella ReichertURNER,Wynetta Seith R, MD  04/28/2015 8:08 AM    [facility] [address], [place], [place]  [address] Phone: [phone]; Fax: [phone]

## 2015-05-01 ENCOUNTER — Encounter: Payer: Self-pay | Admitting: Cardiology

## 2015-05-14 ENCOUNTER — Other Ambulatory Visit (HOSPITAL_BASED_OUTPATIENT_CLINIC_OR_DEPARTMENT_OTHER): Payer: Self-pay | Admitting: Urology

## 2015-05-14 ENCOUNTER — Other Ambulatory Visit (HOSPITAL_BASED_OUTPATIENT_CLINIC_OR_DEPARTMENT_OTHER): Payer: Self-pay | Admitting: *Deleted

## 2015-05-14 DIAGNOSIS — N281 Cyst of kidney, acquired: Secondary | ICD-10-CM

## 2015-05-19 ENCOUNTER — Ambulatory Visit (HOSPITAL_BASED_OUTPATIENT_CLINIC_OR_DEPARTMENT_OTHER)
Admission: RE | Admit: 2015-05-19 | Discharge: 2015-05-19 | Disposition: A | Discharge status: Home / Self Care | Payer: BLUE CROSS/BLUE SHIELD | Source: Ambulatory Visit | Attending: Urology | Admitting: Urology

## 2015-05-19 DIAGNOSIS — N289 Disorder of kidney and ureter, unspecified: Secondary | ICD-10-CM | POA: Diagnosis not present

## 2015-05-19 DIAGNOSIS — N400 Enlarged prostate without lower urinary tract symptoms: Secondary | ICD-10-CM | POA: Insufficient documentation

## 2015-05-19 DIAGNOSIS — N281 Cyst of kidney, acquired: Secondary | ICD-10-CM

## 2015-05-19 DIAGNOSIS — Q6100 Congenital renal cyst, unspecified: Secondary | ICD-10-CM | POA: Insufficient documentation

## 2015-06-04 ENCOUNTER — Encounter: Payer: Self-pay | Admitting: Cardiology

## 2015-06-04 ENCOUNTER — Ambulatory Visit (INDEPENDENT_AMBULATORY_CARE_PROVIDER_SITE_OTHER): Payer: BLUE CROSS/BLUE SHIELD | Admitting: Cardiology

## 2015-06-04 VITALS — BP 126/82 | HR 42 | Ht 69.0 in | Wt 166.0 lb

## 2015-06-04 DIAGNOSIS — I480 Paroxysmal atrial fibrillation: Secondary | ICD-10-CM

## 2015-06-04 DIAGNOSIS — R002 Palpitations: Secondary | ICD-10-CM | POA: Diagnosis not present

## 2015-06-04 NOTE — Progress Notes (Signed)
Cardiology Office Note   Date:  06/04/2015   ID:  Robert [name], DOB 1955/06/16, MRN [id number]  PCP:  No PCP Per Patient  Cardiologist: Cassell Clement MD  Chief Complaint  Patient presents with  . routine follow up    c/o papitations and bradycardia at night      History of Present Illness: Robert [name] is a 60 y.o. male who presents for 6 month follow-up office visit  Robert [name] has a history of PAF occurred one time while he was running.  He has had no recurrence.  He is on diltiazem.  He carries flecainide as a pill in the pocket but has not had to use it.  Marland Kitchen His CHA2DSVASC2 score is 0 and therefore he was not put on oral anticoagulation. His 2-D echo was essentially normal with normal systolic function, grade 2 diastolic dysfunction, moderate right and mild left atrial enlargement. His routine GXT was normal. He is a marathon runner and runs daily.  Recently he was has been having palpitations which occur at night when he is quiet and when he is trying to fall asleep.  These sound like they may be isolated PACs or PVCs.  He is being followed by Dr. Mayford Knife for sleep apnea as well.  The patient has a history of mild hyperlipidemia.  He had a calcium score of his coronary arteries which was 0..  Past Medical History  Diagnosis Date  . PAF (paroxysmal atrial fibrillation) [facility])     Past Surgical History  Procedure Laterality Date  . Hernia repair    . Nasal septum surgery       Current Outpatient Prescriptions  Medication Sig Dispense Refill  . aspirin 81 MG tablet Take 81 mg by mouth daily.    Marland Kitchen diltiazem (CARTIA XT) 120 MG 24 hr capsule Take 1 capsule (120 mg total) by mouth daily. 90 capsule 3  . flecainide (TAMBOCOR) 100 MG tablet Take 2 tablets (200 mg total) by mouth as needed (only 1 time dose, and only if you go into atrial fibrillation/palpitations). 10 tablet 0  . Multiple Vitamins-Minerals (PRESERVISION AREDS 2 PO) Take 1 tablet by mouth daily.     .  valACYclovir (VALTREX) 500 MG tablet Take 500 mg by mouth as needed. For break outs     No current facility-administered medications for this visit.    Allergies:   Review of patient's allergies indicates no known allergies.    Social History:  The patient  reports that he has never smoked. He does not have any smokeless tobacco history on file. He reports that he drinks alcohol. He reports that he does not use illicit drugs.   Family History:  The patient's family history includes Atrial fibrillation in his mother; Kidney disease in his father; Lung cancer in his father.    ROS:  Please see the history of present illness.   Otherwise, review of systems are positive for none.   All other systems are reviewed and negative.    PHYSICAL EXAM: VS:  BP 126/82 mmHg  Pulse 42  Ht  (1.753 m)  Wt 166 lb (75.297 kg)  BMI 24.50 kg/m2 , BMI Body mass index is 24.5 kg/(m^2). GEN: Well nourished, well developed, in no acute distress HEENT: normal Neck: no JVD, carotid bruits, or masses Cardiac: RRR; no murmurs, rubs, or gallops,no edema  Respiratory:  clear to auscultation bilaterally, normal work of breathing GI: soft, nontender, nondistended, + BS MS: no deformity or atrophy Skin:  warm and dry, no rash Neuro:  Strength and sensation are intact Psych: euthymic mood, full affect   EKG:  EKG is ordered today. The ekg ordered today demonstrates sinus bradycardia at 50 bpm.  Incomplete right bundle branch block.  Nonspecific ST segment abnormalities.   Recent Labs: 01/01/2015: ALT 21; BUN 16; Creat 0.74; Hemoglobin 14.5; Platelets 195; Potassium 4.6; Sodium 140; TSH 2.103    Lipid Panel    Component Value Date/Time   CHOL 170 01/01/2015 0001   TRIG 49 01/01/2015 0001   HDL 50 01/01/2015 0001   CHOLHDL 3.4 01/01/2015 0001   VLDL 10 01/01/2015 0001   LDLCALC 110 01/01/2015 0001      Wt Readings from Last 3 Encounters:  06/04/15 166 lb (75.297 kg)  04/28/15 164 lb 9.6 oz  (74.662 kg)  12/17/14 159 lb (72.122 kg)         ASSESSMENT AND PLAN:  1.  Paroxysmal atrial fibrillation.  Chadsvasc score of 0.  No recurrent atrial fibrillation on diltiazem. 2.  Nocturnal palpitations.  Possibly isolated PAC or PVC.  We will have him wear a 30 day event monitor. 3.  Marathon runner with good exercise tolerance.  He had a CT calcium score of 0 in September 2016. 4.  History of sleep apnea.  He is being followed by Dr. Carolanne Grumbling.  He has been trying to use a mouthpiece and is working with dentist Dr. Toni Arthurs.  Physician: Continue current medication.  We will place a 30 day event monitor. Following my retirement he will see Dr. Allyson Sabal in 2 months   Current medicines are reviewed at length with the patient today.  The patient does not have concerns regarding medicines.  The following changes have been made:  no change  Labs/ tests ordered today include:   Orders Placed This Encounter  Procedures  . Cardiac event monitor  . EKG 12-Lead      Signed, Cassell Clement MD 06/04/2015 5:47 PM    [facility] [address] [place], [place], [place]  [address] Phone: [phone]; Fax: [phone]

## 2015-06-04 NOTE — Patient Instructions (Signed)
Medication Instructions:  Your physician recommends that you continue on your current medications as directed. Please refer to the Current Medication list given to you today.   Labwork: NONE  Testing/Procedures: Your physician has recommended that you wear an event monitor. Event monitors are medical devices that record the heart's electrical activity. Doctors most often [place] these monitors to diagnose arrhythmias. Arrhythmias are problems with the speed or rhythm of the heartbeat. The monitor is a small, portable device. You can wear one while you do your normal daily activities. This is usually used to diagnose what is causing palpitations/syncope (passing out). 30 DAY MONITOR  Follow-Up: Your physician recommends that you schedule a follow-up appointment in: 2 MONTH OV WITH DR Allyson Sabal AT THE [facility] OFFICE    If you need a refill on your cardiac medications before your next appointment, please call your pharmacy.

## 2015-06-10 ENCOUNTER — Ambulatory Visit (INDEPENDENT_AMBULATORY_CARE_PROVIDER_SITE_OTHER): Payer: BLUE CROSS/BLUE SHIELD

## 2015-06-10 DIAGNOSIS — R002 Palpitations: Secondary | ICD-10-CM | POA: Diagnosis not present

## 2015-06-10 DIAGNOSIS — I480 Paroxysmal atrial fibrillation: Secondary | ICD-10-CM | POA: Diagnosis not present

## 2015-06-15 ENCOUNTER — Ambulatory Visit: Payer: BLUE CROSS/BLUE SHIELD | Admitting: Cardiology

## 2015-08-04 ENCOUNTER — Encounter: Payer: Self-pay | Admitting: Cardiovascular Disease

## 2015-08-04 ENCOUNTER — Ambulatory Visit (INDEPENDENT_AMBULATORY_CARE_PROVIDER_SITE_OTHER): Payer: BLUE CROSS/BLUE SHIELD | Admitting: Cardiovascular Disease

## 2015-08-04 VITALS — BP 116/76 | HR 56 | Ht 69.0 in | Wt 165.1 lb

## 2015-08-04 DIAGNOSIS — R002 Palpitations: Secondary | ICD-10-CM

## 2015-08-04 DIAGNOSIS — I480 Paroxysmal atrial fibrillation: Secondary | ICD-10-CM

## 2015-08-04 NOTE — Progress Notes (Signed)
08/04/2015 Robert [name]   09-28-1955  [id number]  Primary Physician No PCP Per Patient Primary Cardiologist: Robert Gess MD Robert [name]   HPI:  Mr. Robert [name] a 60 year old well-appearing Caucasian male who relocated from [place], [place] to [place] on 11/30/2013 to work at [employer]. He has no cardiovascular risk factors. He was seen by Dr. Patty [name] on 05/27/14 for PAF. A GXT was normal. An echo showed normal LV systolic function, grade 2 diastolic dysfunction, moderate right and mild left atrial enlargement. His CHA2DSVASC2 score is 0 .He does get occasional episode of decreased exercise tolerance during long runs which he attributes to episodes of peak AF which have been documented in the past. He was not put on an oral anticoagulated because of his low score. He had a coronary calcium score performed last year which was 0. An event monitor showed isolated PVCs. He seized Dr. Mayford Knife for evaluation of obstructive sleep apnea. He has limited caffeine from his diet.  Current Outpatient Prescriptions  Medication Sig Dispense Refill  . aspirin 81 MG tablet Take 81 mg by mouth daily.    Marland Kitchen diltiazem (CARTIA XT) 120 MG 24 hr capsule Take 1 capsule (120 mg total) by mouth daily. 90 capsule 3  . flecainide (TAMBOCOR) 100 MG tablet Take 2 tablets (200 mg total) by mouth as needed (only 1 time dose, and only if you go into atrial fibrillation/palpitations). 10 tablet 0  . Multiple Vitamins-Minerals (PRESERVISION AREDS 2 PO) Take 1 tablet by mouth daily.     . valACYclovir (VALTREX) 500 MG tablet Take 500 mg by mouth as needed. For break outs     No current facility-administered medications for this visit.    No Known Allergies  Social History   Social History  . Marital Status: Single    Spouse Name: N/A  . Number of Children: N/A  . Years of Education: N/A   Occupational History  . Not on file.   Social History Main Topics  . Smoking status: Never Smoker   .  Smokeless tobacco: Not on file  . Alcohol Use: Yes     Comment: occasionally  . Drug Use: No  . Sexual Activity: Yes   Other Topics Concern  . Not on file   Social History Narrative     Review of Systems: General: negative for chills, fever, night sweats or weight changes.  Cardiovascular: negative for chest pain, dyspnea on exertion, edema, orthopnea, palpitations, paroxysmal nocturnal dyspnea or shortness of breath Dermatological: negative for rash Respiratory: negative for cough or wheezing Urologic: negative for hematuria Abdominal: negative for nausea, vomiting, diarrhea, bright red blood per rectum, melena, or hematemesis Neurologic: negative for visual changes, syncope, or dizziness All other systems reviewed and are otherwise negative except as noted above.    Blood pressure 116/76, pulse 56, height  (1.753 m), weight 165 lb 2 oz (74.9 kg), SpO2 98 %.  General appearance: alert and no distress Neck: no adenopathy, no carotid bruit, no JVD, supple, symmetrical, trachea midline and thyroid not enlarged, symmetric, no tenderness/mass/nodules Lungs: clear to auscultation bilaterally Heart: regular rate and rhythm, S1, S2 normal, no murmur, click, rub or gallop Extremities: extremities normal, atraumatic, no cyanosis or edema  EKG not performed today  ASSESSMENT AND PLAN:   PAF (paroxysmal atrial fibrillation) History of PAF maintaining sinus rhythm on diltiazem and aspirin. He does have a prescription for flecainide in the event that he doesn't PAF as "pill in the pocket". The CHA2DSVASC2  score is 0  .  Palpitations History of palpitations with recent event monitor showing only PVCs. He has limited his caffeine intake. He is followed by Dr. Mayford [name] for obstructive sleep apnea. His coronary calcium score is 0 and he has normal LV function by 2-D. I have Reassured Robert [name] that these are benign ice.No significant consequence.      Robert GessJonathan J. Sanaiyah Kirchhoff MD FACP,FACC,FAHA,  [facility] 08/04/2015 8:48 AM

## 2015-08-04 NOTE — Assessment & Plan Note (Signed)
History of palpitations with recent event monitor showing only PVCs. He has limited his caffeine intake. He is followed by Dr. Mayford Knifeurner for obstructive sleep apnea. His coronary calcium score is 0 and he has normal LV function by 2-D. I have Reassured Robert RuizJohn that these are benign ice.No significant consequence.

## 2015-08-04 NOTE — Patient Instructions (Signed)

## 2015-08-04 NOTE — Assessment & Plan Note (Signed)
History of PAF maintaining sinus rhythm on diltiazem and aspirin. He does have a prescription for flecainide in the event that he doesn't PAF as "pill in the pocket". The CHA2DSVASC2 score is 0  .

## 2015-11-04 ENCOUNTER — Encounter (INDEPENDENT_AMBULATORY_CARE_PROVIDER_SITE_OTHER): Payer: Self-pay

## 2015-11-04 ENCOUNTER — Ambulatory Visit (INDEPENDENT_AMBULATORY_CARE_PROVIDER_SITE_OTHER): Payer: BLUE CROSS/BLUE SHIELD | Admitting: Cardiology

## 2015-11-04 ENCOUNTER — Encounter: Payer: Self-pay | Admitting: Cardiology

## 2015-11-04 VITALS — BP 114/70 | HR 88 | Ht 69.0 in | Wt 167.2 lb

## 2015-11-04 DIAGNOSIS — G4733 Obstructive sleep apnea (adult) (pediatric): Secondary | ICD-10-CM | POA: Diagnosis not present

## 2015-11-04 NOTE — Progress Notes (Signed)
Cardiology Office Note    Date:  11/04/2015   ID:  Robert MalesJohn [name], DOB 1955/12/22, MRN [id number]  PCP:  No PCP Per Patient  Cardiologist:  Armanda Magicraci Turner, MD   Chief Complaint  Patient presents with  . Sleep Apnea    History of Present Illness:  Robert [name] is a 60 y.o. male who presents for followup of OSA.He has a history of very mild OSA with an AHI of 7.3 events per hour. Most events occurred during NREM sleep in the supine position. He was referred to Dr. Lazarus SalinesWolicki for ENT evaluation and nothing was recommended. He was then referred to Dr. Toni ArthursFuller for fitting of oral device. He is doing well with his device but is still having some apnea.  He brought his last HST with him from June and his AHI was 9/hr.  He is awaiting further recs from Dr. Toni ArthursFuller.   Past Medical History  Diagnosis Date  . PAF (paroxysmal atrial fibrillation) (HCC)   . OSA (obstructive sleep apnea) 09/16/2014    Past Surgical History  Procedure Laterality Date  . Hernia repair    . Nasal septum surgery      Current Medications: Outpatient Prescriptions Prior to Visit  Medication Sig Dispense Refill  . aspirin 81 MG tablet Take 81 mg by mouth daily.    Marland Kitchen. diltiazem (CARTIA XT) 120 MG 24 hr capsule Take 1 capsule (120 mg total) by mouth daily. 90 capsule 3  . flecainide (TAMBOCOR) 100 MG tablet Take 2 tablets (200 mg total) by mouth as needed (only 1 time dose, and only if you go into atrial fibrillation/palpitations). 10 tablet 0  . Multiple Vitamins-Minerals (PRESERVISION AREDS 2 PO) Take 1 tablet by mouth daily.     . valACYclovir (VALTREX) 500 MG tablet Take 500 mg by mouth as needed. For break outs     No facility-administered medications prior to visit.     Allergies:   Review of patient's allergies indicates no known allergies.   Social History   Social History  . Marital Status: Single    Spouse Name: N/A  . Number of Children: N/A  . Years of Education: N/A   Social History Main  Topics  . Smoking status: Never Smoker   . Smokeless tobacco: None  . Alcohol Use: Yes     Comment: occasionally  . Drug Use: No  . Sexual Activity: Yes   Other Topics Concern  . None   Social History Narrative     Family History:  The patient's family history includes Atrial fibrillation in his mother; Kidney disease in his father; Lung cancer in his father.   ROS:   Please see the history of present illness.    ROS All other systems reviewed and are negative.   PHYSICAL EXAM:   VS:  BP 114/70 mmHg  Pulse 88  Ht 5\' 9"  (1.753 m)  Wt 167 lb 3.2 oz (75.841 kg)  BMI 24.68 kg/m2   GEN: Well nourished, well developed, in no acute distress HEENT: normal Neck: no JVD, carotid bruits, or masses Cardiac: RRR; no murmurs, rubs, or gallops,no edema.  Intact distal pulses bilaterally.  Respiratory:  clear to auscultation bilaterally, normal work of breathing GI: soft, nontender, nondistended, + BS MS: no deformity or atrophy Skin: warm and dry, no rash Neuro:  Alert and Oriented x 3, Strength and sensation are intact Psych: euthymic mood, full affect  Wt Readings from Last 3 Encounters:  11/04/15 167 lb 3.2 oz (75.841 kg)  08/04/15 165 lb 2 oz (74.9 kg)  06/04/15 166 lb (75.297 kg)      Studies/Labs Reviewed:   EKG:  EKG is not ordered today.    Recent Labs: 01/01/2015: ALT 21; BUN 16; Creat 0.74; Hemoglobin 14.5; Platelets 195; Potassium 4.6; Sodium 140; TSH 2.103   Lipid Panel    Component Value Date/Time   CHOL 170 01/01/2015 0001   TRIG 49 01/01/2015 0001   HDL 50 01/01/2015 0001   CHOLHDL 3.4 01/01/2015 0001   VLDL 10 01/01/2015 0001   LDLCALC 110 01/01/2015 0001    Additional studies/ records that were reviewed today include:  none    ASSESSMENT:    1. OSA (obstructive sleep apnea)      PLAN:  In order of problems listed above:  1. OSA - very mild.  He is doing well with his oral device. He feels refreshed in the am but does feel tired when he  gets home from work.  He says that he thinks he is still snoring some but not as bad.  His last HST showed an AHI of 9.2/hr and he says that his dentist is adjusting his oral device.  Await final recs by Dr. Toni Arthurs.    Medication Adjustments/Labs and Tests Ordered: Current medicines are reviewed at length with the patient today.  Concerns regarding medicines are outlined above.  Medication changes, Labs and Tests ordered today are listed in the Patient Instructions below.  There are no Patient Instructions on file for this visit.   Signed, Armanda Magic, MD  11/04/2015 8:47 AM    [facility] [address] [place], [place], [place]  [address] Phone: [phone]; Fax: [phone]

## 2015-11-04 NOTE — Patient Instructions (Signed)

## 2015-11-10 ENCOUNTER — Encounter: Payer: Self-pay | Admitting: Cardiology

## 2016-01-26 IMAGING — CR DG CHEST 1V PORT
1 series · 1 of 1 positions shown · non-contrast
Comparison: None.

CLINICAL DATA: New onset palpitations this morning during running.

EXAM:
PORTABLE CHEST - 1 VIEW

[AP]
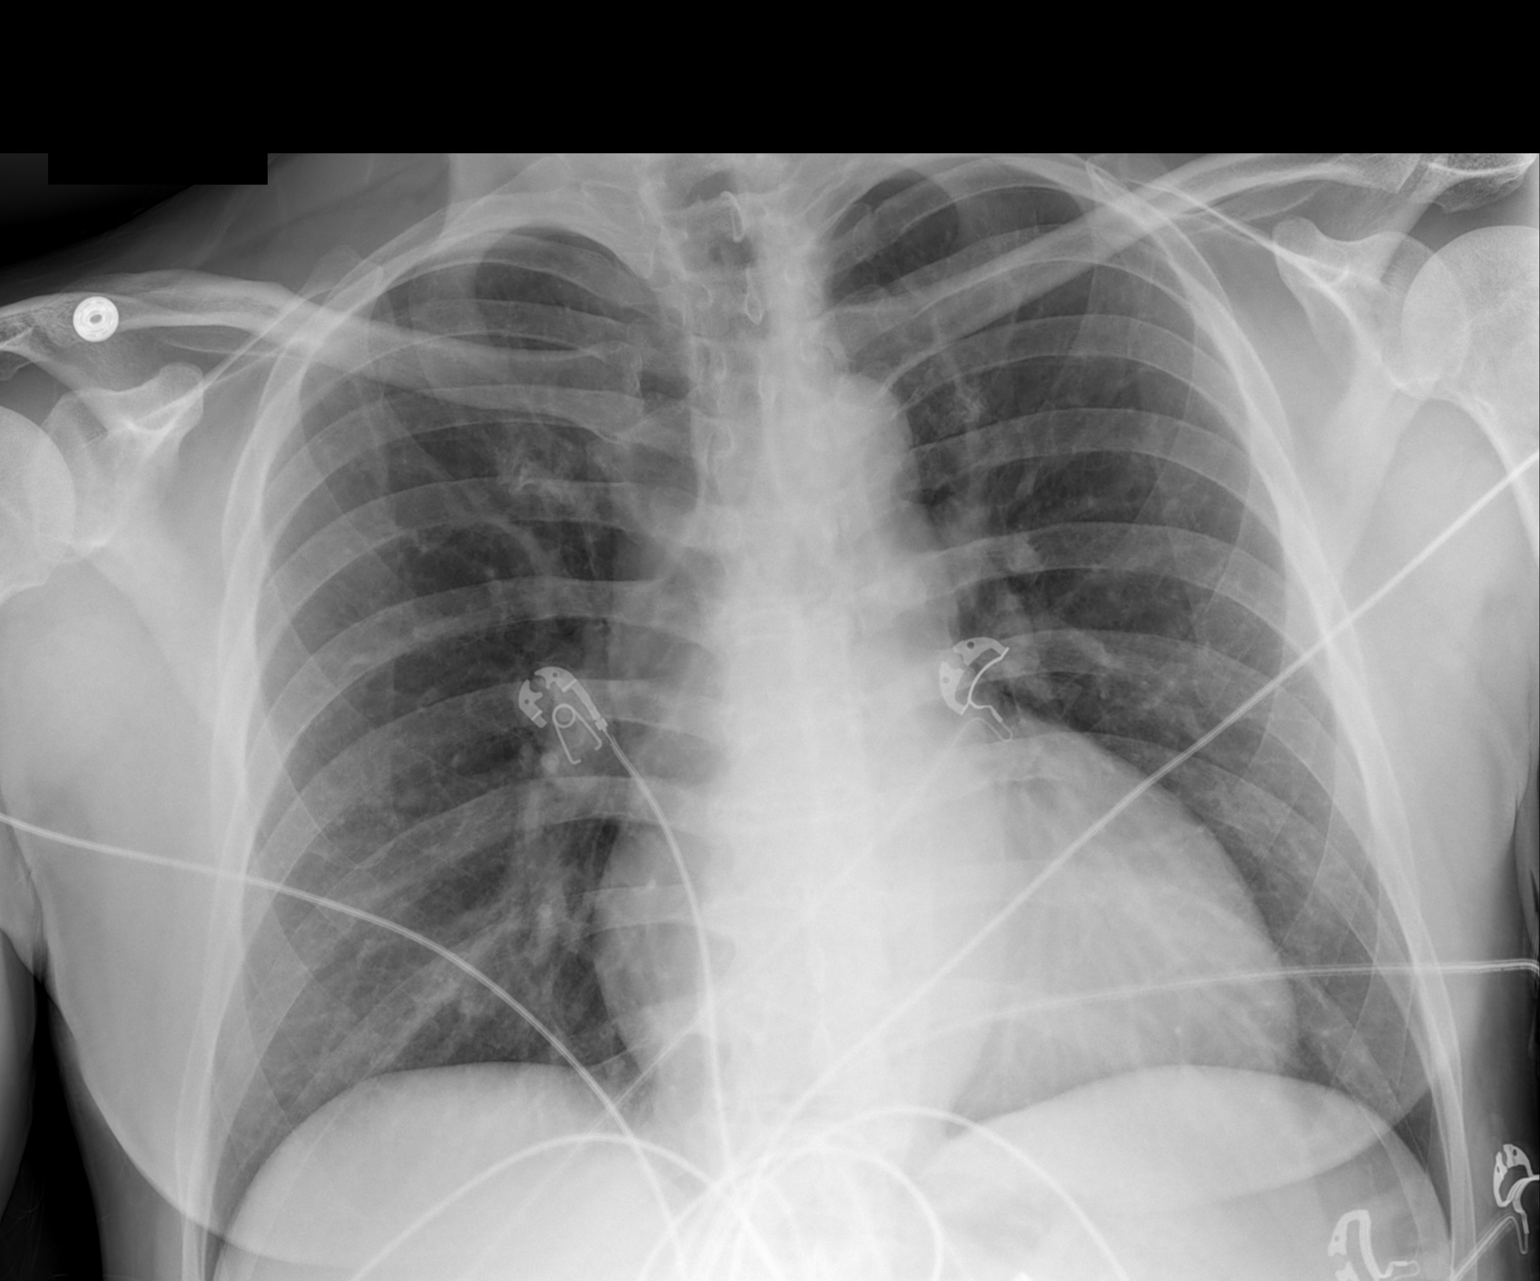

[1 of 1 positions shown; findings below may reference images not displayed]

FINDINGS: Trachea is midline. Heart size is accentuated by AP semi upright
technique. Lungs are clear. No pleural fluid. No pneumothorax.
IMPRESSION: No acute findings.

## 2016-03-07 ENCOUNTER — Other Ambulatory Visit: Payer: Self-pay | Admitting: Cardiovascular Disease

## 2016-03-28 DIAGNOSIS — D225 Melanocytic nevi of trunk: Secondary | ICD-10-CM | POA: Diagnosis not present

## 2016-03-28 DIAGNOSIS — C4441 Basal cell carcinoma of skin of scalp and neck: Secondary | ICD-10-CM | POA: Diagnosis not present

## 2016-03-28 DIAGNOSIS — Z85828 Personal history of other malignant neoplasm of skin: Secondary | ICD-10-CM | POA: Diagnosis not present

## 2016-03-28 DIAGNOSIS — L814 Other melanin hyperpigmentation: Secondary | ICD-10-CM | POA: Diagnosis not present

## 2016-03-28 DIAGNOSIS — L821 Other seborrheic keratosis: Secondary | ICD-10-CM | POA: Diagnosis not present

## 2016-04-27 ENCOUNTER — Ambulatory Visit: Payer: BLUE CROSS/BLUE SHIELD | Admitting: Cardiovascular Disease

## 2016-05-16 ENCOUNTER — Telehealth: Payer: Self-pay | Admitting: Cardiovascular Disease

## 2016-05-16 NOTE — Telephone Encounter (Signed)
Closed encounter °

## 2016-05-24 ENCOUNTER — Ambulatory Visit: Payer: BLUE CROSS/BLUE SHIELD | Admitting: Cardiovascular Disease

## 2016-06-07 ENCOUNTER — Ambulatory Visit (INDEPENDENT_AMBULATORY_CARE_PROVIDER_SITE_OTHER): Payer: BLUE CROSS/BLUE SHIELD | Admitting: Cardiovascular Disease

## 2016-06-07 ENCOUNTER — Encounter: Payer: Self-pay | Admitting: Cardiovascular Disease

## 2016-06-07 VITALS — BP 128/86 | HR 59 | Ht 69.0 in | Wt 166.0 lb

## 2016-06-07 DIAGNOSIS — G4733 Obstructive sleep apnea (adult) (pediatric): Secondary | ICD-10-CM

## 2016-06-07 DIAGNOSIS — I480 Paroxysmal atrial fibrillation: Secondary | ICD-10-CM

## 2016-06-07 MED ORDER — FLECAINIDE ACETATE 100 MG PO TABS: 200.0000 mg | ORAL_TABLET | ORAL | 0 refills | Status: DC | PRN

## 2016-06-07 NOTE — Patient Instructions (Signed)
Medication Instructions: Your physician recommends that you continue on your current medications as directed. Please refer to the Current Medication list given to you today.  Labwork: Your physician recommends that you return for lab work:  CBC, TSH, Free T4, Lipid, hepatic, hemoglobin A1C  Follow-Up: Your physician wants you to follow-up in: 1 year with Dr. Allyson SabalBerry. You will receive a reminder letter in the mail two months in advance. If you don't receive a letter, please call our office to schedule the follow-up appointment.  If you need a refill on your cardiac medications before your next appointment, please call your pharmacy.

## 2016-06-07 NOTE — Addendum Note (Signed)
Addended by: Evans LanceSTOVER, Miryah Ralls W on: 06/07/2016 09:02 AM   Modules accepted: Orders

## 2016-06-07 NOTE — Assessment & Plan Note (Signed)
History of obstructive sleep apnea using a dental device but not CPAP. He is followed by Dr. Mayford Knifeurner.

## 2016-06-07 NOTE — Assessment & Plan Note (Signed)
History of paroxysmal fibrillation back in February 2016 without recurrence. He does carry flecainide as a "pill in the pocket" which she has not had to use. His CHA2DSVASc2 score was 0 and therefore he is not on anticoagulant. He has had an event monitor which showed isolated PVCs as well.

## 2016-06-07 NOTE — Progress Notes (Signed)
06/07/2016 [name]   Aug 13, 1955  [id number]  Primary Physician No PCP Per Patient Primary Cardiologist: Lorretta Harp MD Renae Gloss  HPI:   Mr. Armijo a 62 year old well-appearing Caucasian male who relocated from [place], [place] to [place] on 11/30/2013 to work at [employer]. He has no cardiovascular risk factors. He was seen by Dr. Mare Ferrari on 05/27/14 for PAF. A GXT was normal. An echo showed normal LV systolic function, grade 2 diastolic dysfunction, moderate right and mild left atrial enlargement. I last saw him in the office 08/04/15 His CHA2DSVASC2 score is 0 .He does get occasional episode of decreased exercise tolerance during long runs which he attributes to episodes of peak AF which have been documented in the past. He was not put on an oral anticoagulated because of his low score. He had a coronary calcium score performed last year which was 0. An event monitor showed isolated PVCs. He sees Dr. Radford Pax for evaluation of obstructive sleep apnea. He has limited caffeine from his diet. He has in the past limited his alcohol intake as well which has reduced his symptoms of palpitations.   Current Outpatient Prescriptions  Medication Sig Dispense Refill  . aspirin 81 MG tablet Take 81 mg by mouth daily.    Marland Kitchen CARTIA XT 120 MG 24 hr capsule TAKE 1 CAPSULE DAILY 90 capsule 3  . flecainide (TAMBOCOR) 100 MG tablet Take 2 tablets (200 mg total) by mouth as needed (only 1 time dose, and only if you go into atrial fibrillation/palpitations). 10 tablet 0  . Multiple Vitamins-Minerals (PRESERVISION AREDS 2 PO) Take 1 tablet by mouth daily.     Marland Kitchen triamcinolone cream (KENALOG) 0.1 % Apply 1 application topically daily as needed. Skin irritation  3  . valACYclovir (VALTREX) 500 MG tablet Take 500 mg by mouth as needed. For break outs     No current facility-administered medications for this visit.     No Known Allergies  Social History   Social History  .  Marital status: Single    Spouse name: N/A  . Number of children: N/A  . Years of education: N/A   Occupational History  . Not on file.   Social History Main Topics  . Smoking status: Never Smoker  . Smokeless tobacco: Never Used  . Alcohol use Yes     Comment: occasionally  . Drug use: No  . Sexual activity: Yes   Other Topics Concern  . Not on file   Social History Narrative  . No narrative on file     Review of Systems: General: negative for chills, fever, night sweats or weight changes.  Cardiovascular: negative for chest pain, dyspnea on exertion, edema, orthopnea, palpitations, paroxysmal nocturnal dyspnea or shortness of breath Dermatological: negative for rash Respiratory: negative for cough or wheezing Urologic: negative for hematuria Abdominal: negative for nausea, vomiting, diarrhea, bright red blood per rectum, melena, or hematemesis Neurologic: negative for visual changes, syncope, or dizziness All other systems reviewed and are otherwise negative except as noted above.    Blood pressure 128/86, pulse (!) 59, height _0  (1.753 m), weight 166 lb (75.3 kg).  General appearance: alert and no distress Neck: no adenopathy, no carotid bruit, no JVD, supple, symmetrical, trachea midline and thyroid not enlarged, symmetric, no tenderness/mass/nodules Lungs: clear to auscultation bilaterally Heart: regular rate and rhythm, S1, S2 normal, no murmur, click, rub or gallop Extremities: extremities normal, atraumatic, no cyanosis or edema  EKG sinus bradycardia  at 52 with incomplete right bundle-branch block and nonspecific ST and T-wave changes. I personally reviewed this EKG  ASSESSMENT AND PLAN:   PAF (paroxysmal atrial fibrillation) History of paroxysmal fibrillation back in February 2016 without recurrence. He does carry flecainide as a "pill in the pocket" which she has not had to use. His CHA2DSVASc2 score was 0 and therefore he is not on anticoagulant. He has  had an event monitor which showed isolated PVCs as well.  OSA (obstructive sleep apnea) History of obstructive sleep apnea using a dental device but not CPAP. He is followed by Dr. Radford Pax.      Lorretta Harp MD FACP,FACC,FAHA, [facility] 06/07/2016 8:59 AM

## 2016-06-13 DIAGNOSIS — G4733 Obstructive sleep apnea (adult) (pediatric): Secondary | ICD-10-CM | POA: Diagnosis not present

## 2016-06-13 DIAGNOSIS — I480 Paroxysmal atrial fibrillation: Secondary | ICD-10-CM | POA: Diagnosis not present

## 2016-06-13 LAB — HEMOGLOBIN A1C
Hgb A1c MFr Bld: 5.3 % (ref <5.7)
MEAN PLASMA GLUCOSE: 105 mg/dL

## 2016-06-13 LAB — HEPATIC FUNCTION PANEL
ALBUMIN: 4.3 g/dL (ref 3.6–5.1)
ALT: 26 U/L (ref 9–46)
AST: 22 U/L (ref 10–35)
Alkaline Phosphatase: 56 U/L (ref 40–115)
BILIRUBIN DIRECT: 0.2 mg/dL (ref <=0.2)
BILIRUBIN TOTAL: 1 mg/dL (ref 0.2–1.2)
Indirect Bilirubin: 0.8 mg/dL (ref 0.2–1.2)
TOTAL PROTEIN: 6.4 g/dL (ref 6.1–8.1)

## 2016-06-13 LAB — CBC WITH DIFFERENTIAL/PLATELET
BASOS ABS: 0 {cells}/uL (ref 0–200)
Basophils Relative: 0 %
EOS ABS: 84 {cells}/uL (ref 15–500)
EOS PCT: 2 %
HCT: 42.9 % (ref 38.5–50.0)
Hemoglobin: 14.2 g/dL (ref 13.2–17.1)
Lymphocytes Relative: 34 %
Lymphs Abs: 1428 cells/uL (ref 850–3900)
MCH: 29.3 pg (ref 27.0–33.0)
MCHC: 33.1 g/dL (ref 32.0–36.0)
MCV: 88.5 fL (ref 80.0–100.0)
MONOS PCT: 8 %
MPV: 11.1 fL (ref 7.5–12.5)
Monocytes Absolute: 336 cells/uL (ref 200–950)
NEUTROS ABS: 2352 {cells}/uL (ref 1500–7800)
NEUTROS PCT: 56 %
PLATELETS: 229 10*3/uL (ref 140–400)
RBC: 4.85 MIL/uL (ref 4.20–5.80)
RDW: 13.6 % (ref 11.0–15.0)
WBC: 4.2 10*3/uL (ref 3.8–10.8)

## 2016-06-13 LAB — LIPID PANEL
CHOL/HDL RATIO: 2.7 ratio (ref <5.0)
Cholesterol: 167 mg/dL (ref <200)
HDL: 63 mg/dL (ref >40)
LDL Cholesterol: 94 mg/dL (ref <100)
Triglycerides: 49 mg/dL (ref <150)
VLDL: 10 mg/dL (ref <30)

## 2016-06-13 LAB — T4, FREE: Free T4: 1.1 ng/dL (ref 0.8–1.8)

## 2016-06-13 LAB — TSH: TSH: 1.72 m[IU]/L (ref 0.40–4.50)

## 2016-06-16 ENCOUNTER — Telehealth: Payer: Self-pay | Admitting: Cardiovascular Disease

## 2016-06-16 NOTE — Telephone Encounter (Signed)
Follow up  ° ° °Patient calling for test results.  °

## 2016-06-16 NOTE — Telephone Encounter (Signed)
Left message for pt to c/b RE: lipid and liver results.

## 2016-06-16 NOTE — Telephone Encounter (Signed)
Reviewed results of lab (FLP/Liver) with pt who states understanding.

## 2016-11-03 ENCOUNTER — Ambulatory Visit (INDEPENDENT_AMBULATORY_CARE_PROVIDER_SITE_OTHER): Payer: BLUE CROSS/BLUE SHIELD | Admitting: Cardiology

## 2016-11-03 ENCOUNTER — Encounter: Payer: Self-pay | Admitting: Cardiology

## 2016-11-03 VITALS — BP 120/80 | HR 52 | Ht 69.0 in | Wt 167.8 lb

## 2016-11-03 DIAGNOSIS — G4733 Obstructive sleep apnea (adult) (pediatric): Secondary | ICD-10-CM | POA: Diagnosis not present

## 2016-11-03 NOTE — Patient Instructions (Addendum)
Medication Instructions:  Your physician recommends that you continue on your current medications as directed. Please refer to the Current Medication list given to you today.   Labwork: None Ordered   Testing/Procedures: None Ordered   Follow-Up: None Ordered   Any Other Special Instructions Will Be Listed Below (If Applicable).  Dr. Malachy Moodurners nurse will give you a call back regarding sleep study   If you need a refill on your cardiac medications before your next appointment, please call your pharmacy.

## 2016-11-03 NOTE — Progress Notes (Signed)
11/03/2016 Robert MalesJohn Butkus   1956/02/27  [id number]  Primary Physician Patient, No Pcp Per Primary Cardiologist: Dr. Allyson SabalBerry Dr. Mayford Knifeurner: ([facility])   Reason for Visit/CC: F/u for OSA  HPI:  Robert MalesJohn [name] is a 61 y.o. male who is being seen today for f/u for OSA, which is followed by Dr. Mayford Knifeurner. He also sees Dr. Allyson SabalBerry for PAF. He is on Cardizem for rate control. No breakthrough afib. No symptoms. His CHA2DS2 VASc score is 0, thus he is not on OAC. He is very active. He is a jogger and physically fit. He was screened for CAD with a calcium score, which was 0. He was last seen by Dr. Allyson SabalBerry 05/2016 and was stable w/o cardiac complaints and instructed to f/u in 1 year with him.   He was last seen by Dr. Mayford Knifeurner for OSA 10/2015. When initially diagnosed, his OSA was not severe and he was not bothered with excess daytime sleepiness. Subsequently, he was sent to Dr. Toni ArthursFuller, DDS, for an oral device. His last Snore Report, 09/2015, showed an AHI/REI of 9.2.   Today in f/u, he denies any cardiac symptoms. Resting HR is 52 bpm. He admits that he is not fully compliant with is oral device due to intolerance from a "poor fit". Pt reports it hurts his jaw. He sleeps with the device, on average, 1 night a week. He continues to deny daytime fatigue and somnolence.     Current Meds  Medication Sig  . aspirin 81 MG tablet Take 81 mg by mouth daily.  Marland Kitchen. CARTIA XT 120 MG 24 hr capsule TAKE 1 CAPSULE DAILY  . flecainide (TAMBOCOR) 100 MG tablet Take 2 tablets (200 mg total) by mouth as needed (only 1 time dose, and only if you go into atrial fibrillation/palpitations).  . Multiple Vitamins-Minerals (PRESERVISION AREDS 2 PO) Take 1 tablet by mouth daily.   . valACYclovir (VALTREX) 500 MG tablet Take 500 mg by mouth as needed. For break outs   No Known Allergies Past Medical History:  Diagnosis Date  . OSA (obstructive sleep apnea) 09/16/2014  . PAF (paroxysmal atrial fibrillation) (HCC)    Family History    Problem Relation Age of Onset  . Atrial fibrillation Mother   . Lung cancer Father   . Kidney disease Father    Past Surgical History:  Procedure Laterality Date  . HERNIA REPAIR    . NASAL SEPTUM SURGERY     Social History   Social History  . Marital status: Single    Spouse name: N/A  . Number of children: N/A  . Years of education: N/A   Occupational History  . Not on file.   Social History Main Topics  . Smoking status: Never Smoker  . Smokeless tobacco: Never Used  . Alcohol use Yes     Comment: occasionally  . Drug use: No  . Sexual activity: Yes   Other Topics Concern  . Not on file   Social History Narrative  . No narrative on file     Review of Systems: General: negative for chills, fever, night sweats or weight changes.  Cardiovascular: negative for chest pain, dyspnea on exertion, edema, orthopnea, palpitations, paroxysmal nocturnal dyspnea or shortness of breath Dermatological: negative for rash Respiratory: negative for cough or wheezing Urologic: negative for hematuria Abdominal: negative for nausea, vomiting, diarrhea, bright red blood per rectum, melena, or hematemesis Neurologic: negative for visual changes, syncope, or dizziness All other systems reviewed and are otherwise negative except as noted  above.   Physical Exam:  Blood pressure 120/80, pulse (!) 52, height 5\' 9"  (1.753 m), weight 167 lb 12.8 oz (76.1 kg), SpO2 98 %.  General appearance: alert, cooperative and no distress Neck: no carotid bruit and no JVD Lungs: clear to auscultation bilaterally Heart: regular rate and rhythm, S1, S2 normal, no murmur, click, rub or gallop Extremities: extremities normal, atraumatic, no cyanosis or edema Pulses: 2+ and symmetric Skin: Skin color, texture, turgor normal. No rashes or lesions Neurologic: Grossly normal  EKG not performed  -- personally reviewed   ASSESSMENT AND PLAN:   1. OSA: has oral device but non compliant due to intolerance  from poor fit/ jaw pain. He denies any daytime fatigue or somnolence. His last Snore Report, 09/2015, showed an AHI/REI of 9.2. We discussed the association between untreated OSA and worsening of atrial arrhthymias, which he has a h/o. I stressed importance of improved compliance and recommended that he f/u with Dr. Toni Arthurs to discuss modification of oral device to help improve comfortably and compliance.   2. PAF: on rate control with Cardizem. Resting HR in the 50s. Asymptomatic w/o symptoms of breakthrough afib. CHA2DS2 VASc score is 0, thus no need for OAC. He avoids caffeine and limits ETOH. We discussed improving compliance with OSA therapy.    Follow-Up: will route info to Dr. Mayford Knife. Her RN will contact pt regarding timing of f/u. Continue scheduled f/u with Dr. Allyson Sabal for other cardiac issues.   Zannie Runkle Delmer Islam, MHS [facility] 11/03/2016 9:21 AM

## 2016-11-07 ENCOUNTER — Telehealth: Payer: Self-pay | Admitting: *Deleted

## 2016-11-07 NOTE — Telephone Encounter (Signed)
LMTCB

## 2016-11-07 NOTE — Telephone Encounter (Signed)
-----   Message from Henrietta DineKathryn A Kemp, RN sent at 11/07/2016  2:45 PM EDT ----- Please read Brittainy's note and follow up appropriately. Dr. Mayford Knifeurner should let you know when she needs to see him back. Thanks! ----- Message ----- From: Allayne ButcherSimmons, Brittainy M, PA-C Sent: 11/03/2016   9:48 AM To: Quintella Reichertraci R Turner, MD, Henrietta DineKathryn A Kemp, RN  I saw this patient today for OSA. Any further recommendations? Timing of F/u?

## 2016-11-10 NOTE — Telephone Encounter (Signed)
Please get a home sleep study and overnight pulse ox with patient not using his device  If he has only mild OSA then ok to stop oral device

## 2016-11-10 NOTE — Telephone Encounter (Signed)
-----   Message from Henrietta DineKathryn A Kemp, RN sent at 11/07/2016  5:18 PM EDT -----   ----- Message ----- From: Allayne ButcherSimmons, Brittainy M, PA-C Sent: 11/07/2016   5:02 PM To: Henrietta DineKathryn A Kemp, RN  Can you let pt know?  Thanks.

## 2016-11-10 NOTE — Telephone Encounter (Signed)
Reached out to the patient to follow up with him since his visit with Cathlean MarseillesBritainy Simmons.  Patient still complains of having severe jaw pain when wearing his oral device. Patient states the pain is greater when he removes it than when he is wearing it. Since his office visit day 3,4 and 5 have very painful with the 5th day being the worst. Patient states he has had several fitting appointments with Dr Toni ArthursFuller with last one being 13 months ago and with each fitting his jaw aches more. Patient states for the last 5 consecutive days he has been in so much pain that he can not eat anything.  Patient states it does not hurt when eating only when he takes it out. Patient states it is painful to eat chips or even bananas and he has decided to ditch his oral device because he is tired of being in pain. Patient says Dr fuller is also his dentist and he states each time he goes to her for a dental visit he is never asked how he is doing with his oral device. Patient states his jaw feels like TMJ when he removes the oral device. Patient states his jaw feels numb like when you get a shot of novocaine. Patient states he is not going to wear the device anymore.

## 2016-11-25 NOTE — Telephone Encounter (Signed)
Patient has agreed to do a home sleep study and a overnight pulse ox. Patient understands when the test comes back if he only has mild OSA Dr Mayford Knifeurner says it will be ok to stop using his oral device.

## 2016-12-08 NOTE — Telephone Encounter (Signed)
Informed patient of upcoming home sleep study and patient understanding was verbalized. Patient understands his sleep study will be done at home. Patient understands he will receive a call form [facility] in a week or so. Patient understands to call if he does not receive that call in a timely manner. Patient agrees with treatment and thanked me for call

## 2017-01-17 ENCOUNTER — Telehealth: Payer: Self-pay | Admitting: *Deleted

## 2017-01-17 DIAGNOSIS — L738 Other specified follicular disorders: Secondary | ICD-10-CM | POA: Diagnosis not present

## 2017-01-17 DIAGNOSIS — L905 Scar conditions and fibrosis of skin: Secondary | ICD-10-CM | POA: Diagnosis not present

## 2017-01-17 DIAGNOSIS — G4733 Obstructive sleep apnea (adult) (pediatric): Secondary | ICD-10-CM

## 2017-01-17 DIAGNOSIS — L2084 Intrinsic (allergic) eczema: Secondary | ICD-10-CM | POA: Diagnosis not present

## 2017-01-17 DIAGNOSIS — R21 Rash and other nonspecific skin eruption: Secondary | ICD-10-CM | POA: Diagnosis not present

## 2017-01-17 NOTE — Telephone Encounter (Signed)
Per Dr Mayford Knife overnight pulse ox with patient not using his device ordered and faxed to [facility]. All paperwork needed has been faxed. Patient has been notified.

## 2017-01-21 ENCOUNTER — Encounter: Payer: Self-pay | Admitting: Cardiology

## 2017-01-27 NOTE — Telephone Encounter (Signed)
Home sleep study was completed on January 28 2017.

## 2017-01-30 NOTE — Telephone Encounter (Signed)
Informed patient of sleep study results and patient understanding was verbalized. Patient understands he has moderate sleep apnea with a max AHI of 36/hr and oxygen desaturations as low as 81%. Patient understands Dr Mayford Knife recommends a CPAP titration to treat his sleep apnea. Patient agrees with treatment and thanked me for calling. Patient states he will call back with a time that is good for him to set up the titration.

## 2017-01-30 NOTE — Addendum Note (Signed)
Addended by: Reesa Chew on: 01/30/2017 02:34 PM   Modules accepted: Orders

## 2017-01-30 NOTE — Telephone Encounter (Signed)
-----   Message from Quintella Reichert, MD sent at 01/28/2017  3:10 PM EDT ----- Moderate to severe OSA with max AHI 36/hr with oxygen desaturations as low as 81%.  Please set up CPAP titration.

## 2017-01-31 ENCOUNTER — Encounter: Payer: Self-pay | Admitting: *Deleted

## 2017-01-31 NOTE — Telephone Encounter (Signed)
Informed patient of his CPAP titration and verbalized understanding was indicated. Patient understands his CPAP Titration is scheduled for Tuesday February 07 2017. Patient understands his Titration study will be done at [facility] sleep lab. Patient understands he will receive a sleep packet in a week or so. Patient understands to call if he does not receive the sleep packet in a timely manner. Patient agrees with treatment and thanked me for call.

## 2017-02-07 ENCOUNTER — Ambulatory Visit (HOSPITAL_BASED_OUTPATIENT_CLINIC_OR_DEPARTMENT_OTHER): Payer: BLUE CROSS/BLUE SHIELD | Attending: Cardiology | Admitting: Cardiology

## 2017-02-07 DIAGNOSIS — G4733 Obstructive sleep apnea (adult) (pediatric): Secondary | ICD-10-CM | POA: Insufficient documentation

## 2017-02-07 DIAGNOSIS — Z7982 Long term (current) use of aspirin: Secondary | ICD-10-CM | POA: Insufficient documentation

## 2017-02-12 NOTE — Procedures (Signed)
   Patient Name: Robert [name], Robert [name] Study Date: 02/07/2017 Gender: Male D.O.B: 08-Jul-1955 Age (years): [age] Referring Provider: Armanda Magicraci Karem Farha MD, ABSM Height (inches): 69 Interpreting Physician: Armanda Magicraci Morrie Daywalt MD, ABSM Weight (lbs): 160 RPSGT: Wylie HailDavis, Rico BMI: 24 MRN: [id number] Neck Size: 14.25  CLINICAL INFORMATION The patient is referred for a CPAP titration to treat sleep apnea.  SLEEP STUDY TECHNIQUE As per the AASM Manual for the Scoring of Sleep and Associated Events v2.3 (April 2016) with a hypopnea requiring 4% desaturations.  The channels recorded and monitored were frontal, central and occipital EEG, electrooculogram (EOG), submentalis EMG (chin), nasal and oral airflow, thoracic and abdominal wall motion, anterior tibialis EMG, snore microphone, electrocardiogram, and pulse oximetry. Continuous positive airway pressure (CPAP) was initiated at the beginning of the study and titrated to treat sleep-disordered breathing.  MEDICATIONS Medications self-administered by patient taken the night of the study : ASPIRIN  TECHNICIAN COMMENTS Comments added by technician: BATHROOM BREAKS X1. PATIENT TOLERATED CPAP WITHOUT ANY DIFFICULTY. PATIENT REPORTED TAKING NIGHT MEDICATIONS AT 2100  Comments added by scorer: N/A  RESPIRATORY PARAMETERS Optimal PAP Pressure (cm): 7  AHI at Optimal Pressure (/hr):0.0 Overall Minimal O2 (%):92.00  Supine % at Optimal Pressure (%):39 Minimal O2 at Optimal Pressure (%): 92.0    SLEEP ARCHITECTURE The study was initiated at 10:28:08 PM and ended at 4:33:09 AM.  Sleep onset time was 44.4 minutes and the sleep efficiency was 55.8%. The total sleep time was 203.5 minutes.  The patient spent 12.04% of the night in stage N1 sleep, 75.92% in stage N2 sleep, 0.00% in stage N3 and 12.04% in REM.Stage REM latency was 174.0 minutes  Wake after sleep onset was 117.1. Alpha intrusion was absent. Supine sleep was 40.54%.  CARDIAC DATA The 2 lead EKG  demonstrated sinus rhythm. The mean heart rate was 43.57 beats per minute. Other EKG findings include: PVCs.  LEG MOVEMENT DATA The total Periodic Limb Movements of Sleep (PLMS) were 80. The PLMS index was 23.59. A PLMS index of <15 is considered normal in adults.  IMPRESSIONS - The optimal PAP pressure was 7 cm of water. - Central sleep apnea was not noted during this titration (CAI = 0.0/h). - Significant oxygen desaturations were not observed during this titration (min O2 = 92.00%). - The patient snored with moderate snoring volume during this titration study. - 2-lead EKG demonstrated: PVCs - Mild periodic limb movements were observed during this study. Arousals associated with PLMs were rare.  DIAGNOSIS - Obstructive Sleep Apnea (327.23 [G47.33 ICD-10])  RECOMMENDATIONS - Trial of CPAP therapy on 7 cm H2O with a Small size Philips Respironics Nasal Pillow Mask Nuance Pro Gel mask and heated humidification. - Avoid alcohol, sedatives and other CNS depressants that may worsen sleep apnea and disrupt normal sleep architecture. - Sleep hygiene should be reviewed to assess factors that may improve sleep quality. - Weight management and regular exercise should be initiated or continued. - Return to Sleep Center for re-evaluation after 10 weeks of therapy  Armanda Magicraci Reilly Blades Diplomate, American Board of Sleep Medicine  ELECTRONICALLY SIGNED ON:  02/12/2017, 9:00 PM [facility] PH: [phone]   FX: [phone] ACCREDITED BY THE AMERICAN ACADEMY OF SLEEP MEDICINE

## 2017-02-21 ENCOUNTER — Telehealth: Payer: Self-pay | Admitting: *Deleted

## 2017-02-21 NOTE — Telephone Encounter (Signed)
-----   Message from Quintella Reichertraci R Turner, MD sent at 02/12/2017  9:02 PM EDT ----- Please let patient know that they had a successful PAP titration and let DME know that orders are in EPIC.  Please set up 10 week OV with me.

## 2017-02-21 NOTE — Telephone Encounter (Signed)
Informed patient of titration results and verbalized understanding was indicated. Patient understands he had a successful CPAP titration. Patient understands Dr Mayford Knifeurner has orded him a CPAP in Epic. Patient understands he will be contacted by [facility] in [place], [place]  to set up his cpap. He understands to call if [facility] not [facility] him with new setup in a timely manner. He understands he will be called once confirmation has been [facility] that he has received his new machine to schedule 10 week follow up appointment.  [facility], ph# [phone], fax # [phone], notified of new cpap order Please add to [facility] He was grateful for the call and thanked me.

## 2017-02-24 NOTE — Telephone Encounter (Signed)
Office notes from 11/04/2015 (TT) and 11/03/2016 (BS) faxed to [facility] for patient to number [phone].

## 2017-03-01 NOTE — Telephone Encounter (Addendum)
Patient has decided to change his DME PER his insurance to [facility] [place]. Reached out to AlfredJosh at [facility] at [phone] and faxed all paperwork needed to [phone] today 03/03/17.

## 2017-03-02 ENCOUNTER — Other Ambulatory Visit: Payer: Self-pay | Admitting: Cardiovascular Disease

## 2017-04-25 DIAGNOSIS — G4733 Obstructive sleep apnea (adult) (pediatric): Secondary | ICD-10-CM | POA: Diagnosis not present

## 2017-05-26 DIAGNOSIS — G4733 Obstructive sleep apnea (adult) (pediatric): Secondary | ICD-10-CM | POA: Diagnosis not present

## 2017-05-31 ENCOUNTER — Other Ambulatory Visit: Payer: Self-pay | Admitting: Cardiovascular Disease

## 2017-06-05 ENCOUNTER — Telehealth: Payer: Self-pay | Admitting: *Deleted

## 2017-06-05 NOTE — Telephone Encounter (Signed)
Patient has a 10 week follow up appointment scheduled for July 13 2017. Patient understands he needs to keep this appointment for insurance compliance. Patient was grateful for the call and thanked me.

## 2017-06-14 ENCOUNTER — Ambulatory Visit: Payer: BLUE CROSS/BLUE SHIELD | Admitting: Cardiovascular Disease

## 2017-06-14 ENCOUNTER — Encounter: Payer: Self-pay | Admitting: Cardiovascular Disease

## 2017-06-14 VITALS — BP 134/78 | HR 52 | Ht 69.0 in | Wt 162.0 lb

## 2017-06-14 DIAGNOSIS — I480 Paroxysmal atrial fibrillation: Secondary | ICD-10-CM

## 2017-06-14 DIAGNOSIS — E7800 Pure hypercholesterolemia, unspecified: Secondary | ICD-10-CM

## 2017-06-14 DIAGNOSIS — E785 Hyperlipidemia, unspecified: Secondary | ICD-10-CM | POA: Insufficient documentation

## 2017-06-14 NOTE — Assessment & Plan Note (Signed)
History of PAF with a CHADS2VASc2 score of 0. He did have an event monitor performed a year ago that showed some PVCs and a run of PSVT. Since I saw me year ago he's had no palpitations at all. He has limited his caffeine and alcohol intake.

## 2017-06-14 NOTE — Patient Instructions (Signed)

## 2017-06-14 NOTE — Progress Notes (Signed)
06/14/2017 Robert [name]   Jul 01, 1955  [id number]  Primary Physician System, Pcp Not In Primary Cardiologist: Robert Harp MD Robert [name]  HPI:  Robert [name] is a 62 y.o.  well-appearing Caucasian male who relocated from [place], [place] to [place] on 11/30/2013 to work at [employer]. He has retired since I last saw him and currently lives in [place]. I last saw him in the office 06/07/16. He has no cardiovascular risk factors. He was seen by Dr. Mare [name] on 05/27/14 for PAF. A GXT was normal. An echo showed normal LV systolic function, grade 2 diastolic dysfunction, moderate right and mild left atrial enlargement. I last saw him in the office 08/04/15 His CHA2DSVASC2 score is 0 .He does get occasional episode of decreased exercise tolerance during long runs which he attributes to episodes of peak AF which have been documented in the past. He was not put on an oral anticoagulated because of his low score. He had a coronary calcium score performed last year which was 0. An event monitor showed isolated PVCs. He sees Dr. Radford [name] for evaluation of obstructive sleep apnea. He has limited caffeine from his diet. He has in the past limited his alcohol intake as well which has reduced his symptoms of palpitations. Since I saw him year ago he says he's had no palpitations. He has retired and currently lives in [place]. He did have a Myoview stress test performed 05/03/17 that was entirely normal with EF of 60% done because of "an abnormal EKG".    Current Meds  Medication Sig  . aspirin 81 MG tablet Take 81 mg by mouth daily.  Marland Kitchen CARTIA XT 120 MG 24 hr capsule TAKE 1 CAPSULE DAILY  . flecainide (TAMBOCOR) 100 MG tablet Take 2 tablets (200 mg total) by mouth as needed (only 1 time dose, and only if you go into atrial fibrillation/palpitations).  . Multiple Vitamins-Minerals (PRESERVISION AREDS 2 PO) Take 1 tablet by mouth daily.   . valACYclovir (VALTREX) 500  MG tablet Take 500 mg by mouth as needed. For break outs     No Known Allergies  Social History   Socioeconomic History  . Marital status: Single    Spouse name: Not on file  . Number of children: Not on file  . Years of education: Not on file  . Highest education level: Not on file  Social Needs  . Financial resource strain: Not on file  . Food insecurity - worry: Not on file  . Food insecurity - inability: Not on file  . Transportation needs - medical: Not on file  . Transportation needs - non-medical: Not on file  Occupational History  . Not on file  Tobacco Use  . Smoking status: Never Smoker  . Smokeless tobacco: Never Used  Substance and Sexual Activity  . Alcohol use: Yes    Comment: occasionally  . Drug use: No  . Sexual activity: Yes  Other Topics Concern  . Not on file  Social History Narrative  . Not on file     Review of Systems: General: negative for chills, fever, night sweats or weight changes.  Cardiovascular: negative for chest pain, dyspnea on exertion, edema, orthopnea, palpitations, paroxysmal nocturnal dyspnea or shortness of breath Dermatological: negative for rash Respiratory: negative for cough or wheezing Urologic: negative for hematuria Abdominal: negative for nausea, vomiting, diarrhea, bright red blood per rectum, melena, or hematemesis Neurologic: negative for visual changes, syncope, or dizziness All  other systems reviewed and are otherwise negative except as noted above.    Blood pressure 134/78, pulse (!) 52, height _0  (1.753 m), weight 162 lb (73.5 kg).  General appearance: alert and no distress Neck: no adenopathy, no carotid bruit, no JVD, supple, symmetrical, trachea midline and thyroid not enlarged, symmetric, no tenderness/mass/nodules Lungs: clear to auscultation bilaterally Heart: regular rate and rhythm, S1, S2 normal, no murmur, click, rub or gallop Extremities: extremities normal, atraumatic, no cyanosis or  edema Pulses: 2+ and symmetric Skin: Skin color, texture, turgor normal. No rashes or lesions Neurologic: Alert and oriented X 3, normal strength and tone. Normal symmetric reflexes. Normal coordination and gait  EKG sinus bradycardia at 52 with incomplete right bundle-branch block and inferior T wave inversion. I personally reviewed this EKG.  ASSESSMENT AND PLAN:   PAF (paroxysmal atrial fibrillation) History of PAF with a CHADS2VASc2 score of 0. He did have an event monitor performed a year ago that showed some PVCs and a run of PSVT. Since I saw me year ago he's had no palpitations at all. He has limited his caffeine and alcohol intake.  Hyperlipidemia Recent blood work performed by his PCP on 04/25/17 revealed total cholesterol 200, LDL of 1:30 and HDL of 60.      Robert Harp MD Robert [name], [facility] 06/14/2017 12:17 PM

## 2017-06-14 NOTE — Assessment & Plan Note (Signed)
Recent blood work performed by his PCP on 04/25/17 revealed total cholesterol 200, LDL of 1:30 and HDL of 60.

## 2017-06-16 ENCOUNTER — Ambulatory Visit: Payer: BLUE CROSS/BLUE SHIELD | Admitting: Cardiovascular Disease

## 2017-06-23 DIAGNOSIS — G4733 Obstructive sleep apnea (adult) (pediatric): Secondary | ICD-10-CM | POA: Diagnosis not present

## 2017-07-05 ENCOUNTER — Encounter: Payer: Self-pay | Admitting: Cardiology

## 2017-07-12 ENCOUNTER — Ambulatory Visit: Payer: BLUE CROSS/BLUE SHIELD | Admitting: Cardiovascular Disease

## 2017-07-12 ENCOUNTER — Encounter: Payer: Self-pay | Admitting: Cardiology

## 2017-07-13 ENCOUNTER — Ambulatory Visit: Payer: BLUE CROSS/BLUE SHIELD | Admitting: Cardiology

## 2017-07-13 ENCOUNTER — Encounter: Payer: Self-pay | Admitting: Cardiology

## 2017-07-13 VITALS — BP 108/74 | HR 57 | Ht 69.0 in | Wt 164.6 lb

## 2017-07-13 DIAGNOSIS — G4733 Obstructive sleep apnea (adult) (pediatric): Secondary | ICD-10-CM

## 2017-07-13 NOTE — Telephone Encounter (Signed)
Called patients DME for a download spoke to StrathmoreMargarita who states Angie is the respiratory therapist who will fax our office the download. I have not been able to reach Angie at [phone]. Davonna BellingMargarita will try to reach angie for me as the patient has an appointment this morning.

## 2017-07-13 NOTE — Progress Notes (Signed)
Cardiology Office Note:    Date:  07/13/2017   ID:  Robert MalesJohn Esteve, DOB 28-Apr-1955, MRN [id number]  PCP:  System, Pcp Not In  Cardiologist:  No primary care provider on file.    Referring MD: No ref. provider found   Chief Complaint  Patient presents with  . Sleep Apnea    History of Present Illness:    Robert MalesJohn [name] is a 62 y.o. male with a hx of very mild OSA with an AHI of 7.3 events per hour. Most events occurred during NREM sleep in the supine position. He was referred to Dr. Lazarus SalinesWolicki for ENT evaluation and nothing was recommended. He was then referred to Dr. Toni ArthursFuller for fitting of oral device. He was seen by Boyce MediciBrittany Simmons on 11/03/2016 and was noncompliant with his oral device due to intolerance from poor fit and jaw pain.  He was instructed to follow-up with Dr. Toni ArthursFuller.  He later decided that he could no longer use the oral device because of severe jaw pain.  He underwent home sleep study which showed moderate obstructive sleep apnea with an AHI of 22.7/h with oxygen saturations as low as 81%.  He subsequently underwent CPAP titration to 7 cm of water pressure but was placed on auto CPAP.  He is now here for follow-up.  he is doing well with his CPAP device and thinks that he has gotten used to it.  He tolerates the nasal pillow mask and feels the pressure is adequate.  Since going on CPAP he feels rested in the am and has no significant daytime sleepiness.  He denies any significant mouth or nasal dryness or nasal congestion.  He does not think that he snores.  He says that he is now dreaming more than before CPAP.  He is active running 5-6 miles daily and playing pickle ball.     Past Medical History:  Diagnosis Date  . OSA (obstructive sleep apnea) 09/16/2014  . PAF (paroxysmal atrial fibrillation) (HCC)     Past Surgical History:  Procedure Laterality Date  . HERNIA REPAIR    . NASAL SEPTUM SURGERY      Current Medications: Current Meds  Medication Sig  . aspirin  81 MG tablet Take 81 mg by mouth daily.  Marland Kitchen. CARTIA XT 120 MG 24 hr capsule TAKE 1 CAPSULE DAILY  . diltiazem (CARTIA XT) 120 MG 24 hr capsule Take 120 mg by mouth daily.  . flecainide (TAMBOCOR) 100 MG tablet Take 2 tablets (200 mg total) by mouth as needed (only 1 time dose, and only if you go into atrial fibrillation/palpitations).  . Multiple Vitamins-Minerals (PRESERVISION AREDS 2 PO) Take 1 tablet by mouth daily.   . valACYclovir (VALTREX) 500 MG tablet Take 500 mg by mouth daily as needed (use as directed.). For break outs     Allergies:   Patient has no known allergies.   Social History   Socioeconomic History  . Marital status: Single    Spouse name: Not on file  . Number of children: Not on file  . Years of education: Not on file  . Highest education level: Not on file  Occupational History  . Not on file  Social Needs  . Financial resource strain: Not on file  . Food insecurity:    Worry: Not on file    Inability: Not on file  . Transportation needs:    Medical: Not on file    Non-medical: Not on file  Tobacco Use  . Smoking status: Never  Smoker  . Smokeless tobacco: Never Used  Substance and Sexual Activity  . Alcohol use: Yes    Comment: occasionally  . Drug use: No  . Sexual activity: Yes  Lifestyle  . Physical activity:    Days per week: Not on file    Minutes per session: Not on file  . Stress: Not on file  Relationships  . Social connections:    Talks on phone: Not on file    Gets together: Not on file    Attends religious service: Not on file    Active member of club or organization: Not on file    Attends meetings of clubs or organizations: Not on file    Relationship status: Not on file  Other Topics Concern  . Not on file  Social History Narrative  . Not on file     Family History: The patient's family history includes Atrial fibrillation in his mother; Kidney disease in his father; Lung cancer in his father.  ROS:   Please see the history  of present illness.    ROS  All other systems reviewed and negative.   EKGs/Labs/Other Studies Reviewed:    The following studies were reviewed today: PAP download  EKG:  EKG is not ordered today.    Recent Labs: No results found for requested labs within last 8760 hours.   Recent Lipid Panel    Component Value Date/Time   CHOL 167 06/13/2016 1102   TRIG 49 06/13/2016 1102   HDL 63 06/13/2016 1102   CHOLHDL 2.7 06/13/2016 1102   VLDL 10 06/13/2016 1102   LDLCALC 94 06/13/2016 1102    Physical Exam:    VS:  BP 108/74   Pulse (!) 57   Ht 5\' 9"  (1.753 m)   Wt 164 lb 9.6 oz (74.7 kg)   BMI 24.31 kg/m     Wt Readings from Last 3 Encounters:  07/13/17 164 lb 9.6 oz (74.7 kg)  06/14/17 162 lb (73.5 kg)  02/07/17 160 lb (72.6 kg)     GEN:  Well nourished, well developed in no acute distress HEENT: Normal NECK: No JVD; No carotid bruits LYMPHATICS: No lymphadenopathy CARDIAC: RRR, no murmurs, rubs, gallops RESPIRATORY:  Clear to auscultation without rales, wheezing or rhonchi  ABDOMEN: Soft, non-tender, non-distended MUSCULOSKELETAL:  No edema; No deformity  SKIN: Warm and dry NEUROLOGIC:  Alert and oriented x 3 PSYCHIATRIC:  Normal affect   ASSESSMENT:    1. OSA (obstructive sleep apnea)    PLAN:    In order of problems listed above:  1. OSA - the patient is tolerating PAP therapy well without any problems. The PAP download was reviewed today and showed an AHI of 4.2/hr on auto CPAP cm H2O with 86% compliance in using more than 4 hours nightly.  The patient has been using and benefiting from PAP use and will continue to benefit from therapy.      Medication Adjustments/Labs and Tests Ordered: Current medicines are reviewed at length with the patient today.  Concerns regarding medicines are outlined above.  No orders of the defined types were placed in this encounter.  No orders of the defined types were placed in this encounter.   Signed, Armanda Magic,  MD  07/13/2017 12:27 PM    [facility]

## 2017-07-13 NOTE — Patient Instructions (Signed)
Medication Instructions:  Your physician recommends that you continue on your current medications as directed. Please refer to the Current Medication list given to you today.  If you need a refill on your cardiac medications, please contact your pharmacy first.  Labwork: None ordered   Testing/Procedures: None ordered   Follow-Up: Your physician wants you to follow-up in: 1 year with Dr. Turner. You will receive a reminder letter in the mail two months in advance. If you don't receive a letter, please call our office to schedule the follow-up appointment.  Any Other Special Instructions Will Be Listed Below (If Applicable).   Thank you for choosing [facility]    Rena Idonia Zollinger, RN  [phone]  If you need a refill on your cardiac medications before your next appointment, please call your pharmacy.   

## 2017-07-24 DIAGNOSIS — G4733 Obstructive sleep apnea (adult) (pediatric): Secondary | ICD-10-CM | POA: Diagnosis not present

## 2017-08-10 DIAGNOSIS — L821 Other seborrheic keratosis: Secondary | ICD-10-CM | POA: Diagnosis not present

## 2017-08-10 DIAGNOSIS — L304 Erythema intertrigo: Secondary | ICD-10-CM | POA: Diagnosis not present

## 2017-08-10 DIAGNOSIS — L2084 Intrinsic (allergic) eczema: Secondary | ICD-10-CM | POA: Diagnosis not present

## 2017-08-10 DIAGNOSIS — L738 Other specified follicular disorders: Secondary | ICD-10-CM | POA: Diagnosis not present

## 2017-08-23 DIAGNOSIS — G4733 Obstructive sleep apnea (adult) (pediatric): Secondary | ICD-10-CM | POA: Diagnosis not present

## 2017-09-23 DIAGNOSIS — G4733 Obstructive sleep apnea (adult) (pediatric): Secondary | ICD-10-CM | POA: Diagnosis not present

## 2017-10-23 DIAGNOSIS — G4733 Obstructive sleep apnea (adult) (pediatric): Secondary | ICD-10-CM | POA: Diagnosis not present

## 2017-11-23 DIAGNOSIS — G4733 Obstructive sleep apnea (adult) (pediatric): Secondary | ICD-10-CM | POA: Diagnosis not present

## 2017-11-27 ENCOUNTER — Other Ambulatory Visit: Payer: Self-pay | Admitting: Cardiovascular Disease

## 2017-11-27 NOTE — Telephone Encounter (Signed)
Rx request sent to pharmacy.  

## 2017-12-24 DIAGNOSIS — G4733 Obstructive sleep apnea (adult) (pediatric): Secondary | ICD-10-CM | POA: Diagnosis not present

## 2018-01-22 DIAGNOSIS — D2271 Melanocytic nevi of right lower limb, including hip: Secondary | ICD-10-CM | POA: Diagnosis not present

## 2018-01-22 DIAGNOSIS — L905 Scar conditions and fibrosis of skin: Secondary | ICD-10-CM | POA: Diagnosis not present

## 2018-01-22 DIAGNOSIS — L814 Other melanin hyperpigmentation: Secondary | ICD-10-CM | POA: Diagnosis not present

## 2018-01-22 DIAGNOSIS — D2272 Melanocytic nevi of left lower limb, including hip: Secondary | ICD-10-CM | POA: Diagnosis not present

## 2018-01-22 DIAGNOSIS — D485 Neoplasm of uncertain behavior of skin: Secondary | ICD-10-CM | POA: Diagnosis not present

## 2018-01-22 DIAGNOSIS — D225 Melanocytic nevi of trunk: Secondary | ICD-10-CM | POA: Diagnosis not present

## 2018-01-23 DIAGNOSIS — G4733 Obstructive sleep apnea (adult) (pediatric): Secondary | ICD-10-CM | POA: Diagnosis not present

## 2018-02-06 DIAGNOSIS — G4733 Obstructive sleep apnea (adult) (pediatric): Secondary | ICD-10-CM | POA: Diagnosis not present

## 2018-06-07 DIAGNOSIS — R972 Elevated prostate specific antigen [PSA]: Secondary | ICD-10-CM | POA: Diagnosis not present

## 2018-06-21 DIAGNOSIS — M25551 Pain in right hip: Secondary | ICD-10-CM | POA: Diagnosis not present

## 2018-06-21 DIAGNOSIS — R972 Elevated prostate specific antigen [PSA]: Secondary | ICD-10-CM | POA: Diagnosis not present

## 2018-06-26 DIAGNOSIS — M25651 Stiffness of right hip, not elsewhere classified: Secondary | ICD-10-CM | POA: Diagnosis not present

## 2018-06-26 DIAGNOSIS — M25551 Pain in right hip: Secondary | ICD-10-CM | POA: Diagnosis not present

## 2018-06-26 DIAGNOSIS — M6281 Muscle weakness (generalized): Secondary | ICD-10-CM | POA: Diagnosis not present

## 2018-06-26 DIAGNOSIS — R262 Difficulty in walking, not elsewhere classified: Secondary | ICD-10-CM | POA: Diagnosis not present

## 2018-07-04 DIAGNOSIS — M25651 Stiffness of right hip, not elsewhere classified: Secondary | ICD-10-CM | POA: Diagnosis not present

## 2018-07-04 DIAGNOSIS — M6281 Muscle weakness (generalized): Secondary | ICD-10-CM | POA: Diagnosis not present

## 2018-07-04 DIAGNOSIS — M25551 Pain in right hip: Secondary | ICD-10-CM | POA: Diagnosis not present

## 2018-07-04 DIAGNOSIS — R262 Difficulty in walking, not elsewhere classified: Secondary | ICD-10-CM | POA: Diagnosis not present

## 2018-07-06 DIAGNOSIS — R262 Difficulty in walking, not elsewhere classified: Secondary | ICD-10-CM | POA: Diagnosis not present

## 2018-07-06 DIAGNOSIS — M25651 Stiffness of right hip, not elsewhere classified: Secondary | ICD-10-CM | POA: Diagnosis not present

## 2018-07-06 DIAGNOSIS — M6281 Muscle weakness (generalized): Secondary | ICD-10-CM | POA: Diagnosis not present

## 2018-07-06 DIAGNOSIS — M25551 Pain in right hip: Secondary | ICD-10-CM | POA: Diagnosis not present

## 2018-07-10 DIAGNOSIS — M25651 Stiffness of right hip, not elsewhere classified: Secondary | ICD-10-CM | POA: Diagnosis not present

## 2018-07-10 DIAGNOSIS — M25551 Pain in right hip: Secondary | ICD-10-CM | POA: Diagnosis not present

## 2018-07-10 DIAGNOSIS — R262 Difficulty in walking, not elsewhere classified: Secondary | ICD-10-CM | POA: Diagnosis not present

## 2018-07-10 DIAGNOSIS — M6281 Muscle weakness (generalized): Secondary | ICD-10-CM | POA: Diagnosis not present

## 2018-07-12 DIAGNOSIS — M25551 Pain in right hip: Secondary | ICD-10-CM | POA: Diagnosis not present

## 2018-07-12 DIAGNOSIS — R262 Difficulty in walking, not elsewhere classified: Secondary | ICD-10-CM | POA: Diagnosis not present

## 2018-07-12 DIAGNOSIS — M6281 Muscle weakness (generalized): Secondary | ICD-10-CM | POA: Diagnosis not present

## 2018-07-12 DIAGNOSIS — M25651 Stiffness of right hip, not elsewhere classified: Secondary | ICD-10-CM | POA: Diagnosis not present

## 2018-07-17 DIAGNOSIS — M6281 Muscle weakness (generalized): Secondary | ICD-10-CM | POA: Diagnosis not present

## 2018-07-17 DIAGNOSIS — R262 Difficulty in walking, not elsewhere classified: Secondary | ICD-10-CM | POA: Diagnosis not present

## 2018-07-17 DIAGNOSIS — M25551 Pain in right hip: Secondary | ICD-10-CM | POA: Diagnosis not present

## 2018-07-17 DIAGNOSIS — M25651 Stiffness of right hip, not elsewhere classified: Secondary | ICD-10-CM | POA: Diagnosis not present

## 2018-08-02 ENCOUNTER — Telehealth: Payer: Self-pay | Admitting: Cardiology

## 2018-08-02 NOTE — Telephone Encounter (Signed)
New Message    Pt wants to know if he needs to change his appt with Dr Mayford Knife   Please call back

## 2018-08-03 NOTE — Telephone Encounter (Signed)
F/U Message           Patient returned Ben's call, pls call again

## 2018-08-03 NOTE — Telephone Encounter (Signed)
.   Virtual Visit Pre-Appointment Phone Call  Steps For Call:  1. Confirm consent - "In the setting of the current Covid19 crisis, you are scheduled for a  Video visit with your provider on 08/10/18 at 4 pm.  Just as we do with many in-office visits, in order for you to participate in this visit, we must obtain consent.  If you'd like, I can send this to your mychart (if signed up) or email for you to review.  Otherwise, I can obtain your verbal consent now.  All virtual visits are billed to your insurance company just like a normal visit would be.  By agreeing to a virtual visit, we'd like you to understand that the technology does not allow for your provider to perform an examination, and thus may limit your provider's ability to fully assess your condition.  Finally, though the technology is pretty good, we cannot assure that it will always work on either your or our end, and in the setting of a video visit, we may have to convert it to a phone-only visit.  In either situation, we cannot ensure that we have a secure connection.  Are you willing to proceed?" STAFF: Did the patient verbally acknowledge consent to telehealth visit? Document YES/NO here: YES  2. Confirm the BEST phone number to call the day of the visit by including in appointment notes  3. Give patient instructions for WebEx/MyChart download to smartphone as below or Doximity/Doxy.me if video visit (depending on what platform provider is using)  4. Advise patient to be prepared with their blood pressure, heart rate, weight, any heart rhythm information, their current medicines, and a piece of paper and pen handy for any instructions they may receive the day of their visit  5. Inform patient they will receive a phone call 15 minutes prior to their appointment time (may be from unknown caller ID) so they should be prepared to answer  6. Confirm that appointment type is correct in Epic appointment notes (VIDEO vs PHONE)      TELEPHONE CALL NOTE  Robert [name] has been deemed a candidate for a follow-up tele-health visit to limit community exposure during the Covid-19 pandemic. I spoke with the patient via phone to ensure availability of phone/video source, confirm preferred email & phone number, and discuss instructions and expectations.  I reminded Robert [name] to be prepared with any vital sign and/or heart rhythm information that could potentially be obtained via home monitoring, at the time of his visit. I reminded Robert [name] to expect a phone call at the time of his visit if his visit.  Robert Flock, RN 08/03/2018 9:53 AM   INSTRUCTIONS FOR DOWNLOADING THE WEBEX APP TO SMARTPHONE  - If Apple, ask patient to go to App Store and type in WebEx in the search bar. Download [employer], the blue/green circle. If Android, go to [employer] and type in [employer] in the search bar. The app is free but as with any other app downloads, their phone may require them to verify saved payment information or Apple/Android password.  - The patient does NOT have to create an account. - On the day of the visit, the assist will walk the patient through joining the meeting with the meeting number/password.  INSTRUCTIONS FOR DOWNLOADING THE MYCHART APP TO SMARTPHONE  - The patient must first make sure to have activated MyChart and know their login information - If Apple, go to [employer] and type in [employer]  in the search bar and download the app. If Android, ask patient to go to [employer] and type in [place] in the search bar and download the app. The app is free but as with any other app downloads, their phone may require them to verify saved payment information or Apple/Android password.  - The patient will need to then log into the app with their MyChart username and password, and select [place] as their healthcare provider to link the account. When it is time for your visit, go to the MyChart app,  find appointments, and click Begin Video Visit. Be sure to Select Allow for your device to access the Microphone and Camera for your visit. You will then be connected, and your provider will be with you shortly.  **If they have any issues connecting, or need assistance please contact MyChart service desk [phone])**  **If using a computer, in order to ensure the best quality for their visit they will need to use either of the following Internet Browsers: [employer], or Google Chrome**  IF USING DOXIMITY or DOXY.ME - The patient will receive a link just prior to their visit, either by text or email (to be determined day of appointment depending on if it's doxy.me or Doximity).     FULL LENGTH CONSENT FOR TELE-HEALTH VISIT   I hereby voluntarily request, consent and authorize [facility] [place] and its employed or contracted physicians, physician assistants, nurse practitioners or other licensed health care professionals (the Practitioner), to provide me with telemedicine health care services (the "Services") as deemed necessary by the treating Practitioner. I acknowledge and consent to receive the Services by the Practitioner via telemedicine. I understand that the telemedicine visit will involve communicating with the Practitioner through live audiovisual communication technology and the disclosure of certain medical information by electronic transmission. I acknowledge that I have been given the opportunity to request an in-person assessment or other available alternative prior to the telemedicine visit and am voluntarily participating in the telemedicine visit.  I understand that I have the right to withhold or withdraw my consent to the use of telemedicine in the course of my care at any time, without affecting my right to future care or treatment, and that the Practitioner or I may terminate the telemedicine visit at any time. I understand that I have the right to inspect all  information obtained and/or recorded in the course of the telemedicine visit and may receive copies of available information for a reasonable fee.  I understand that some of the potential risks of receiving the Services via telemedicine include:  Marland Kitchen Delay or interruption in medical evaluation due to technological equipment failure or disruption; . Information transmitted may not be sufficient (e.g. poor resolution of images) to allow for appropriate medical decision making by the Practitioner; and/or  . In rare instances, security protocols could fail, causing a breach of personal health information.  Furthermore, I acknowledge that it is my responsibility to provide information about my medical history, conditions and care that is complete and accurate to the best of my ability. I acknowledge that Practitioner's advice, recommendations, and/or decision may be based on factors not within their control, such as incomplete or inaccurate data provided by me or distortions of diagnostic images or specimens that may result from electronic transmissions. I understand that the practice of medicine is not an exact science and that Practitioner makes no warranties or guarantees regarding treatment outcomes. I acknowledge that I will receive a copy of  this consent concurrently upon execution via email to the email address I last provided but may also request a printed copy by calling the office of [facility].    I understand that my insurance will be billed for this visit.   I have read or had this consent read to me. . I understand the contents of this consent, which adequately explains the benefits and risks of the Services being provided via telemedicine.  . I have been provided ample opportunity to ask questions regarding this consent and the Services and have had my questions answered to my satisfaction. . I give my informed consent for the services to be provided through the use of telemedicine in my  medical care  By participating in this telemedicine visit I agree to the above.

## 2018-08-03 NOTE — Telephone Encounter (Signed)
Attempted, will call back later.

## 2018-08-07 DIAGNOSIS — R262 Difficulty in walking, not elsewhere classified: Secondary | ICD-10-CM | POA: Diagnosis not present

## 2018-08-07 DIAGNOSIS — M6281 Muscle weakness (generalized): Secondary | ICD-10-CM | POA: Diagnosis not present

## 2018-08-07 DIAGNOSIS — M25551 Pain in right hip: Secondary | ICD-10-CM | POA: Diagnosis not present

## 2018-08-07 DIAGNOSIS — M25651 Stiffness of right hip, not elsewhere classified: Secondary | ICD-10-CM | POA: Diagnosis not present

## 2018-08-08 ENCOUNTER — Telehealth: Payer: Self-pay | Admitting: *Deleted

## 2018-08-08 ENCOUNTER — Encounter: Payer: Self-pay | Admitting: *Deleted

## 2018-08-08 NOTE — Telephone Encounter (Signed)
Virtual Visit Pre-Appointment Phone Call  "(Name), I am calling you today to discuss your upcoming appointment. We are currently trying to limit exposure to the virus that causes COVID-19 by seeing patients at home rather than in the office."  1. "What is the BEST phone [name] to call the day of the visit?" - include this in appointment notes  2. Do you have or have access to (through a family member/friend) a smartphone with video capability that we can use for your visit?" a. If yes - list this [name] in appt notes as cell (if different from BEST phone #) and list the appointment type as a VIDEO visit in appointment notes b. If no - list the appointment type as a PHONE visit in appointment notes  3. Confirm consent - "In the setting of the current Covid19 crisis, you are scheduled for a (phone or video) visit with your provider on (date) at (time).  Just as we do with many in-office visits, in order for you to participate in this visit, we must obtain consent.  If you'd like, I can send this to your mychart (if signed up) or email for you to review.  Otherwise, I can obtain your verbal consent now.  All virtual visits are billed to your insurance company just like a normal visit would be.  By agreeing to a virtual visit, we'd like you to understand that the technology does not allow for your provider to perform an examination, and thus may limit your provider's ability to fully assess your condition. If your provider identifies any concerns that need to be evaluated in person, we will make arrangements to do so.  Finally, though the technology is pretty good, we cannot assure that it will always work on either your or our end, and in the setting of a video visit, we may have to convert it to a phone-only visit.  In either situation, we cannot ensure that we have a secure connection.  Are you willing to proceed?" STAFF: Did the patient verbally acknowledge consent to telehealth visit? Document  YES/NO here: YES  4. Advise patient to be prepared - "Two hours prior to your appointment, go ahead and check your blood pressure, pulse, oxygen saturation, and your weight (if you have the equipment to check those) and write them all down. When your visit starts, your provider will ask you for this information. If you have an Apple Watch or Kardia device, please plan to have heart rate information ready on the day of your appointment. Please have a pen and paper handy nearby the day of the visit as well."  5. Give patient instructions for MyChart download to smartphone OR Doximity/Doxy.me as below if video visit (depending on what platform provider is using)  6. Inform patient they will receive a phone call 15 minutes prior to their appointment time (may be from unknown caller ID) so they should be prepared to answer    TELEPHONE CALL NOTE  Robert [name] has been deemed a candidate for a follow-up tele-health visit to limit community exposure during the Covid-19 pandemic. I spoke with the patient via phone to ensure availability of phone/video source, confirm preferred email & phone [name], and discuss instructions and expectations.  I reminded Robert [name] to be prepared with any vital sign and/or heart rhythm information that could potentially be obtained via home monitoring, at the time of his visit. I reminded Robert [name] to expect a phone call prior to his visit.  Robert [name], CMA 08/08/2018 5:15 PM   INSTRUCTIONS FOR DOWNLOADING THE MYCHART APP TO SMARTPHONE  - The patient must first make sure to have activated MyChart and know their login information - If Apple, go to [employer] and type in MyChart in the search bar and download the app. If Android, ask patient to go to [employer] and type in [place] in the search bar and download the app. The app is free but as with any other app downloads, their phone may require them to verify saved payment information or  Apple/Android password.  - The patient will need to then log into the app with their MyChart username and password, and select [facility] as their healthcare provider to link the account. When it is time for your visit, go to the MyChart app, find appointments, and click Begin Video Visit. Be sure to Select Allow for your device to access the Microphone and Camera for your visit. You will then be connected, and your provider will be with you shortly.  **If they have any issues connecting, or need assistance please contact MyChart service desk [phone])**  **If using a computer, in order to ensure the best quality for their visit they will need to use either of the following Internet Browsers: [employer] or Google Chrome**  IF USING DOXIMITY or DOXY.ME - The patient will receive a link just prior to their visit by text.     FULL LENGTH CONSENT FOR TELE-HEALTH VISIT   I hereby voluntarily request, consent and authorize [facility] and its employed or contracted physicians, physician assistants, nurse practitioners or other licensed health care professionals (the Practitioner), to provide me with telemedicine health care services (the Services") as deemed necessary by the treating Practitioner. I acknowledge and consent to receive the Services by the Practitioner via telemedicine. I understand that the telemedicine visit will involve communicating with the Practitioner through live audiovisual communication technology and the disclosure of certain medical information by electronic transmission. I acknowledge that I have been given the opportunity to request an in-person assessment or other available alternative prior to the telemedicine visit and am voluntarily participating in the telemedicine visit.  I understand that I have the right to withhold or withdraw my consent to the use of telemedicine in the course of my care at any time, without affecting my right to future care  or treatment, and that the Practitioner or I may terminate the telemedicine visit at any time. I understand that I have the right to inspect all information obtained and/or recorded in the course of the telemedicine visit and may receive copies of available information for a reasonable fee.  I understand that some of the potential risks of receiving the Services via telemedicine include:   Delay or interruption in medical evaluation due to technological equipment failure or disruption;  Information transmitted may not be sufficient (e.g. poor resolution of images) to allow for appropriate medical decision making by the Practitioner; and/or   In rare instances, security protocols could fail, causing a breach of personal health information.  Furthermore, I acknowledge that it is my responsibility to provide information about my medical history, conditions and care that is complete and accurate to the best of my ability. I acknowledge that Practitioner's advice, recommendations, and/or decision may be based on factors not within their control, such as incomplete or inaccurate data provided by me or distortions of diagnostic images or specimens that may result from electronic transmissions. I understand that  the practice of medicine is not an exact science and that Practitioner makes no warranties or guarantees regarding treatment outcomes. I acknowledge that I will receive a copy of this consent concurrently upon execution via email to the email address I last provided but may also request a printed copy by calling the office of [facility].    I understand that my insurance will be billed for this visit.   I have read or had this consent read to me.  I understand the contents of this consent, which adequately explains the benefits and risks of the Services being provided via telemedicine.   I have been provided ample opportunity to ask questions regarding this consent and the Services and have had  my questions answered to my satisfaction.  I give my informed consent for the services to be provided through the use of telemedicine in my medical care  By participating in this telemedicine visit I agree to the above      Cardiac Questionnaire:    Since your last visit or hospitalization:    1. Have you been having new or worsening chest pain? NO   2. Have you been having new or worsening shortness of breath? NO 3. Have you been having new or worsening leg swelling, wt gain, or increase in abdominal girth (pants fitting more tightly)? NO   4. Have you had any passing out spells? NO    *A YES to any of these questions would result in the appointment being kept. *If all the answers to these questions are NO, we should indicate that given the current situation regarding the worldwide coronarvirus pandemic, at the recommendation of the CDC, we are looking to limit gatherings in our waiting area, and thus will reschedule their appointment beyond four weeks from today.   _____________   COVID-19 Pre-Screening Questions:   Do you currently have a fever? NO  Have you recently travelled on a cruise, internationally, or to [place], [place], [place], [place], [place], or [place] [employer] ? NO  Have you been in contact with someone that is currently pending confirmation of Covid19 testing or has been confirmed to have the Covid19 virus?  NO  Are you currently experiencing fatigue or cough? NO      Spoke with patient and he is ok with having a video visit with Dr. Allyson SabalBerry on August 10, 2018. We reviewed his Pharmacy, medications, allergies, and history.

## 2018-08-09 ENCOUNTER — Telehealth: Payer: Self-pay | Admitting: Cardiovascular Disease

## 2018-08-09 NOTE — Telephone Encounter (Signed)
Smartphone/ my chart/ consent/ pre reg completed °

## 2018-08-10 ENCOUNTER — Telehealth: Payer: Self-pay | Admitting: *Deleted

## 2018-08-10 ENCOUNTER — Telehealth: Payer: Self-pay

## 2018-08-10 ENCOUNTER — Ambulatory Visit: Payer: BLUE CROSS/BLUE SHIELD | Admitting: Cardiovascular Disease

## 2018-08-10 ENCOUNTER — Telehealth (INDEPENDENT_AMBULATORY_CARE_PROVIDER_SITE_OTHER): Payer: BLUE CROSS/BLUE SHIELD | Admitting: Cardiovascular Disease

## 2018-08-10 ENCOUNTER — Telehealth (INDEPENDENT_AMBULATORY_CARE_PROVIDER_SITE_OTHER): Payer: BLUE CROSS/BLUE SHIELD | Admitting: Cardiology

## 2018-08-10 ENCOUNTER — Other Ambulatory Visit: Payer: Self-pay

## 2018-08-10 ENCOUNTER — Encounter: Payer: Self-pay | Admitting: Cardiology

## 2018-08-10 VITALS — BP 114/61 | HR 52 | Ht 69.0 in | Wt 162.0 lb

## 2018-08-10 DIAGNOSIS — G4733 Obstructive sleep apnea (adult) (pediatric): Secondary | ICD-10-CM

## 2018-08-10 DIAGNOSIS — I480 Paroxysmal atrial fibrillation: Secondary | ICD-10-CM

## 2018-08-10 DIAGNOSIS — E782 Mixed hyperlipidemia: Secondary | ICD-10-CM | POA: Diagnosis not present

## 2018-08-10 DIAGNOSIS — R002 Palpitations: Secondary | ICD-10-CM

## 2018-08-10 NOTE — Telephone Encounter (Signed)
LVM STATING THAT DR. Allyson Sabal WOULD LIKE PT TO F/U IN 1 YR AND THAT AVS WOULD BE SENT TO ADDRESS ON FILE

## 2018-08-10 NOTE — Progress Notes (Signed)
Virtual Visit via Video Note   This visit type was conducted due to national recommendations for restrictions regarding the COVID-19 Pandemic (e.g. social distancing) in an effort to limit this patient's exposure and mitigate transmission in our community.  Due to his co-morbid illnesses, this patient is at least at moderate risk for complications without adequate follow up.  This format is felt to be most appropriate for this patient at this time.  All issues noted in this document were discussed and addressed.  A limited physical exam was performed with this format.  Please refer to the patient's chart for his consent to telehealth for [facility].   Evaluation Performed:  Follow-up visit  This visit type was conducted due to national recommendations for restrictions regarding the COVID-19 Pandemic (e.g. social distancing).  This format is felt to be most appropriate for this patient at this time.  All issues noted in this document were discussed and addressed.  No physical exam was performed (except for noted visual exam findings with Video Visits).  Please refer to the patient's chart (MyChart message for video visits and phone note for telephone visits) for the patient's consent to telehealth for [facility].  Date:  08/10/2018   ID:  Robert [name], DOB April 22, 1955, MRN [id number]  Patient Location:  [facility]  Provider location:   [facility]  PCP:  System, Pcp Not In  Cardiologist:  Nanetta Batty, MD Sleep Medicine:  Armanda Magic, MD Electrophysiologist:  None   Chief Complaint:  OSA  History of Present Illness:    Robert [name] is a 63 y.o. male who presents via audio/video conferencing for a telehealth visit today.    THis is a 64yo male with a history of PAF who was referred for sleep study due to paroxysmal atrial fibrillation. He was initially dx with very mild OSA with an AHI of 7.3 events per hour. Most events occurred during NREM sleep in the supine position. He was  referred to Dr. Lazarus Salines for ENT evaluation and nothing was recommended. He was then referred to Dr. Toni Arthurs for fitting of oral device. He was seen by Boyce Medici on 11/03/2016 and was noncompliant with his oral device due to intolerance from poor fit and jaw pain.  He was instructed to follow-up with Dr. Toni Arthurs.  He later decided that he could no longer use the oral device because of severe jaw pain.  He underwent home sleep study which showed moderate obstructive sleep apnea with an AHI of 22.7/h with oxygen saturations as low as 81%.  He subsequently underwent CPAP titration to 7 cm of water pressure but was placed on auto CPAP.   He is doing well with his CPAP device and thinks that he has gotten used to it.  He tolerates the mask and feels the pressure is adequate.  Since going on CPAP he feels rested in the am and has no significant daytime sleepiness.  He denies any significant mouth or nasal dryness or nasal congestion.   does not think that he snores.    The patient does not have symptoms concerning for COVID-19 infection (fever, chills, cough, or new shortness of breath).    Prior CV studies:   The following studies were reviewed today:  PAP compliance download  Past Medical History:  Diagnosis Date  . OSA (obstructive sleep apnea) 09/16/2014  . PAF (paroxysmal atrial fibrillation) (HCC)    Past Surgical History:  Procedure Laterality Date  . HERNIA REPAIR    . NASAL SEPTUM SURGERY  Current Meds  Medication Sig  . aspirin 81 MG tablet Take 81 mg by mouth daily.  Marland Kitchen diltiazem (CARTIA XT) 120 MG 24 hr capsule Take 120 mg by mouth daily.  . flecainide (TAMBOCOR) 100 MG tablet Take 2 tablets (200 mg total) by mouth as needed (only 1 time dose, and only if you go into atrial fibrillation/palpitations).  . Multiple Vitamins-Minerals (PRESERVISION AREDS 2 PO) Take 1 tablet by mouth daily.   . valACYclovir (VALTREX) 500 MG tablet Take 500 mg by mouth daily as needed (use as  directed.). For break outs     Allergies:   Patient has no known allergies.   Social History   Tobacco Use  . Smoking status: Never Smoker  . Smokeless tobacco: Never Used  Substance Use Topics  . Alcohol use: Yes    Comment: occasionally  . Drug use: No     Family Hx: The patient's family history includes Atrial fibrillation in his mother; Kidney disease in his father; Lung cancer in his father.  ROS:   Please see the history of present illness.     All other systems reviewed and are negative.   Labs/Other Tests and Data Reviewed:    Recent Labs: No results found for requested labs within last 8760 hours.   Recent Lipid Panel Lab Results  Component Value Date/Time   CHOL 167 06/13/2016 11:02 AM   TRIG 49 06/13/2016 11:02 AM   HDL 63 06/13/2016 11:02 AM   CHOLHDL 2.7 06/13/2016 11:02 AM   LDLCALC 94 06/13/2016 11:02 AM    Wt Readings from Last 3 Encounters:  08/10/18 162 lb (73.5 kg)  08/10/18 162 lb (73.5 kg)  07/13/17 164 lb 9.6 oz (74.7 kg)     Objective:    Vital Signs:  BP 114/61   Pulse (!) 52   Ht 5\' 9"  (1.753 m)   Wt 162 lb (73.5 kg)   BMI 23.92 kg/m    CONSTITUTIONAL:  Well nourished, well developed male in no acute distress.  EYES: anicteric MOUTH: oral mucosa is pink RESPIRATORY: Normal respiratory effort, symmetric expansion CARDIOVASCULAR: No peripheral edema SKIN: No rash, lesions or ulcers MUSCULOSKELETAL: no digital cyanosis NEURO: Cranial Nerves II-XII grossly intact, moves all extremities PSYCH: Intact judgement and insight.  A&O x 3, Mood/affect appropriate   ASSESSMENT & PLAN:    1.  OSA - the patient is tolerating PAP therapy well without any problems.   The patient has been using and benefiting from PAP use and will continue to benefit from therapy.  I will get a download from the DME.  2..COVID-19 Education: The signs and symptoms of COVID-19 were discussed with the patient and how to seek care for testing (follow up with  PCP or arrange E-visit).  The importance of social distancing was discussed today.  Patient Risk:   After full review of this patient's clinical status, I feel that they are at least moderate risk at this time.  Time:   Today, I have spent 15 minutes directly with the patient on video discussing medical problems including OSA.  We also reviewed the symptoms of COVID 19 and the ways to protect against contracting the virus with telehealth technology.   Medication Adjustments/Labs and Tests Ordered: Current medicines are reviewed at length with the patient today.  Concerns regarding medicines are outlined above.  Tests Ordered: No orders of the defined types were placed in this encounter.  Medication Changes: No orders of the defined types were placed in this  encounter.   Disposition:  Follow up in 1 year(s)  Signed, Armanda Magicraci Shantasia Hunnell, MD  08/10/2018 3:28 PM    [facility]

## 2018-08-10 NOTE — Telephone Encounter (Signed)
Order faxed to to [facility] in [facility] for cpap supplies

## 2018-08-10 NOTE — Patient Instructions (Signed)
Medication Instructions:  Your physician recommends that you continue on your current medications as directed. Please refer to the Current Medication list given to you today.  If you need a refill on your cardiac medications before your next appointment, please call your pharmacy.   Lab work: None If you have labs (blood work) drawn today and your tests are completely normal, you will receive your results only by: Marland Kitchen MyChart Message (if you have MyChart) OR . A paper copy in the mail If you have any lab test that is abnormal or we need to change your treatment, we will call you to review the results.  Testing/Procedures: None  Follow-Up: At [facility], you and your health needs are our priority.  As part of our continuing mission to provide you with exceptional heart care, we have created designated Provider Care Teams.  These Care Teams include your primary Cardiologist (physician) and Advanced Practice Providers (APPs -  Physician Assistants and Nurse Practitioners) who all work together to provide you with the care you need, when you need it. You will need a follow up appointment in 1 years.  Please call our office 2 months in advance to schedule this appointment.  You may see Dr. Mayford Knife or one of the following Advanced Practice Providers on your designated Care Team:   [place], PA-C Ronie Spies, PA-C . Jacolyn Reedy, PA-C  Your CPAP supplies have been sent to your DME.

## 2018-08-10 NOTE — Telephone Encounter (Signed)
-----   Message from Dustin Flock, RN sent at 08/10/2018  4:52 PM EDT ----- Regarding: FW: followup Orders placed, please send. Thanks, Romeo Apple ----- Message ----- From: Quintella Reichert, MD Sent: 08/10/2018   3:46 PM EDT To: Dustin Flock, RN Subject: followup                                       Followup with me in 1 year - order new PAP supplies

## 2018-08-10 NOTE — Progress Notes (Signed)
Virtual Visit via Video Note   This visit type was conducted due to national recommendations for restrictions regarding the COVID-19 Pandemic (e.g. social distancing) in an effort to limit this patient's exposure and mitigate transmission in our community.  Due to his co-morbid illnesses, this patient is at least at moderate risk for complications without adequate follow up.  This format is felt to be most appropriate for this patient at this time.  All issues noted in this document were discussed and addressed.  A limited physical exam was performed with this format.  Please refer to the patient's chart for his consent to telehealth for [facility].   Evaluation Performed:  Follow-up visit  Date:  08/10/2018   ID:  Robert [name], DOB 11-11-1955, MRN [id number]  Patient Location: Home Provider Location: Home  PCP:  System, Pcp Not In  Cardiologist: Dr. Quay Burow Electrophysiologist:  None   Chief Complaint: Paroxysmal atrial fibrillation/PVCs  History of Present Illness:    Robert [name] is a 63 y.o.  well-appearing Caucasian male who relocated from [place], [place] to [place] on 11/30/2013 to work at [employer]. He has retired since I last saw him and currently lives in [place]. I last saw him in the office 06/14/2017. He has no cardiovascular risk factors. He was seen by Dr. Mare Ferrari on 05/27/14 for PAF. A GXT was normal. An echo showed normal LV systolic function, grade 2 diastolic dysfunction, moderate right and mild left atrial enlargement.I last saw him in the office 4/18/17His CHA2DSVASC2 score is 0 .He does get occasional episode of decreased exercise tolerance during long runs which he attributes to episodes of peak AF which have been documented in the past. He was not put on an oral anticoagulated because of his low score. He had a coronary calcium score performed last year which was 0. An event monitor showed isolated PVCs. He seesDr. Radford Pax for  evaluation of obstructive sleep apnea which he apparently has and wears CPAP at night which benefits him.  He has limited caffeine from his diet.He has in the past limited his alcohol intake as well which has reduced his symptoms of palpitations. Since I saw him year ago he says he's had no palpitations. He has retired and currently lives in [place]. He did have a Myoview stress test performed 05/03/17 that was entirely normal with EF of 60% done because of "an abnormal EKG".  Since he retired his palpitations have completely resolved.  His vital signs are stable.  He is had no further PAF on [place] and Tambocor which admittedly he takes intermittently.  He exercises frequently and walks 8 to 10 miles a day.  He did run a half marathon since I last spoke to him.  The patient does not have symptoms concerning for COVID-19 infection (fever, chills, cough, or new shortness of breath).    Past Medical History:  Diagnosis Date  . OSA (obstructive sleep apnea) 09/16/2014  . PAF (paroxysmal atrial fibrillation) ([facility])    Past Surgical History:  Procedure Laterality Date  . HERNIA REPAIR    . NASAL SEPTUM SURGERY       Current Meds  Medication Sig  . aspirin 81 MG tablet Take 81 mg by mouth daily.  Marland Kitchen CARTIA XT 120 MG 24 hr capsule TAKE 1 CAPSULE DAILY  . diltiazem (CARTIA XT) 120 MG 24 hr capsule Take 120 mg by mouth daily.  . flecainide (TAMBOCOR) 100 MG tablet Take 2 tablets (200 mg total)  by mouth as needed (only 1 time dose, and only if you go into atrial fibrillation/palpitations).  . Multiple Vitamins-Minerals (PRESERVISION AREDS 2 PO) Take 1 tablet by mouth daily.   . valACYclovir (VALTREX) 500 MG tablet Take 500 mg by mouth daily as needed (use as directed.). For break outs     Allergies:   Patient has no known allergies.   Social History   Tobacco Use  . Smoking status: Never Smoker  . Smokeless tobacco: Never Used  Substance Use Topics  . Alcohol use: Yes    Comment:  occasionally  . Drug use: No     Family Hx: The patient's family history includes Atrial fibrillation in his mother; Kidney disease in his father; Lung cancer in his father.  ROS:   Please see the history of present illness.     All other systems reviewed and are negative.   Prior CV studies:   The following studies were reviewed today:  None  Labs/Other Tests and Data Reviewed:    EKG:  No ECG reviewed.  Recent Labs: No results found for requested labs within last 8760 hours.   Recent Lipid Panel Lab Results  Component Value Date/Time   CHOL 167 06/13/2016 11:02 AM   TRIG 49 06/13/2016 11:02 AM   HDL 63 06/13/2016 11:02 AM   CHOLHDL 2.7 06/13/2016 11:02 AM   LDLCALC 94 06/13/2016 11:02 AM    Wt Readings from Last 3 Encounters:  08/10/18 162 lb (73.5 kg)  07/13/17 164 lb 9.6 oz (74.7 kg)  06/14/17 162 lb (73.5 kg)     Objective:    Vital Signs:  BP 114/61   Pulse (!) 52   Ht _0  (1.753 m)   Wt 162 lb (73.5 kg)   BMI 23.92 kg/m    VITAL SIGNS:  reviewed RESPIRATORY:  normal respiratory effort, symmetric expansion NEURO:  alert and oriented x 3, no obvious focal deficit PSYCH:  normal affect  ASSESSMENT & PLAN:    1. Paroxysmal atrial fibrillation- history of PAF in the past with palpitations, none in the last 12 months on Tambocor and Cardizem.  He is not on oral anticoagulation because of a low CHA2DsVasc score. 2. Obstructive sleep apnea- on CPAP which he benefits from  COVID-19 Education: The signs and symptoms of COVID-19 were discussed with the patient and how to seek care for testing (follow up with PCP or arrange E-visit).  The importance of social distancing was discussed today.  Time:   Today, I have spent 8 minutes with the patient with telehealth technology discussing the above problems.     Medication Adjustments/Labs and Tests Ordered: Current medicines are reviewed at length with the patient today.  Concerns regarding medicines are  outlined above.   Tests Ordered: No orders of the defined types were placed in this encounter.   Medication Changes: No orders of the defined types were placed in this encounter.   Disposition:  Follow up in 1 year(s)  Signed, Quay Burow, MD  08/10/2018 11:28 AM    [facility]

## 2018-08-10 NOTE — Patient Instructions (Signed)

## 2018-08-14 DIAGNOSIS — M25551 Pain in right hip: Secondary | ICD-10-CM | POA: Diagnosis not present

## 2018-08-14 DIAGNOSIS — R262 Difficulty in walking, not elsewhere classified: Secondary | ICD-10-CM | POA: Diagnosis not present

## 2018-08-14 DIAGNOSIS — M25651 Stiffness of right hip, not elsewhere classified: Secondary | ICD-10-CM | POA: Diagnosis not present

## 2018-08-14 DIAGNOSIS — M6281 Muscle weakness (generalized): Secondary | ICD-10-CM | POA: Diagnosis not present

## 2018-08-15 NOTE — Telephone Encounter (Signed)
Download received and awaiting to be scanned in.

## 2018-08-22 ENCOUNTER — Telehealth: Payer: Self-pay | Admitting: *Deleted

## 2018-08-22 NOTE — Telephone Encounter (Signed)
Informed patient of compliance results and verbalized understanding was indicated. Patient is aware and agreeable to AHI being within range at 3.4. Patient is aware and agreeable to being in compliance with machine usage. Patient is aware and agreeable to no change in current pressures. 

## 2018-08-22 NOTE — Telephone Encounter (Signed)
-----   Message from Quintella Reichert, MD sent at 08/22/2018  9:51 AM EDT ----- Good AHI and compliance.  Continue current PAP settings.

## 2018-08-31 DIAGNOSIS — M25551 Pain in right hip: Secondary | ICD-10-CM | POA: Diagnosis not present

## 2018-08-31 DIAGNOSIS — M25651 Stiffness of right hip, not elsewhere classified: Secondary | ICD-10-CM | POA: Diagnosis not present

## 2018-08-31 DIAGNOSIS — M6281 Muscle weakness (generalized): Secondary | ICD-10-CM | POA: Diagnosis not present

## 2018-08-31 DIAGNOSIS — R262 Difficulty in walking, not elsewhere classified: Secondary | ICD-10-CM | POA: Diagnosis not present

## 2018-09-24 DIAGNOSIS — D2272 Melanocytic nevi of left lower limb, including hip: Secondary | ICD-10-CM | POA: Diagnosis not present

## 2018-09-24 DIAGNOSIS — L738 Other specified follicular disorders: Secondary | ICD-10-CM | POA: Diagnosis not present

## 2018-09-24 DIAGNOSIS — L570 Actinic keratosis: Secondary | ICD-10-CM | POA: Diagnosis not present

## 2018-09-24 DIAGNOSIS — D225 Melanocytic nevi of trunk: Secondary | ICD-10-CM | POA: Diagnosis not present

## 2018-09-24 DIAGNOSIS — D2271 Melanocytic nevi of right lower limb, including hip: Secondary | ICD-10-CM | POA: Diagnosis not present

## 2018-12-26 DIAGNOSIS — R7989 Other specified abnormal findings of blood chemistry: Secondary | ICD-10-CM | POA: Diagnosis not present

## 2018-12-26 DIAGNOSIS — Z0000 Encounter for general adult medical examination without abnormal findings: Secondary | ICD-10-CM | POA: Diagnosis not present

## 2019-01-02 DIAGNOSIS — G4733 Obstructive sleep apnea (adult) (pediatric): Secondary | ICD-10-CM | POA: Diagnosis not present

## 2019-01-02 DIAGNOSIS — Z0000 Encounter for general adult medical examination without abnormal findings: Secondary | ICD-10-CM | POA: Diagnosis not present

## 2019-01-02 DIAGNOSIS — M25511 Pain in right shoulder: Secondary | ICD-10-CM | POA: Diagnosis not present

## 2019-01-02 DIAGNOSIS — Z23 Encounter for immunization: Secondary | ICD-10-CM | POA: Diagnosis not present

## 2019-01-02 DIAGNOSIS — I480 Paroxysmal atrial fibrillation: Secondary | ICD-10-CM | POA: Diagnosis not present

## 2019-02-20 DIAGNOSIS — H353121 Nonexudative age-related macular degeneration, left eye, early dry stage: Secondary | ICD-10-CM | POA: Diagnosis not present

## 2019-02-20 DIAGNOSIS — H2513 Age-related nuclear cataract, bilateral: Secondary | ICD-10-CM | POA: Diagnosis not present

## 2019-06-10 DIAGNOSIS — D225 Melanocytic nevi of trunk: Secondary | ICD-10-CM | POA: Diagnosis not present

## 2019-06-10 DIAGNOSIS — L708 Other acne: Secondary | ICD-10-CM | POA: Diagnosis not present

## 2019-06-10 DIAGNOSIS — L821 Other seborrheic keratosis: Secondary | ICD-10-CM | POA: Diagnosis not present

## 2019-06-10 DIAGNOSIS — L814 Other melanin hyperpigmentation: Secondary | ICD-10-CM | POA: Diagnosis not present

## 2019-06-18 DIAGNOSIS — R972 Elevated prostate specific antigen [PSA]: Secondary | ICD-10-CM | POA: Diagnosis not present

## 2019-07-02 DIAGNOSIS — N401 Enlarged prostate with lower urinary tract symptoms: Secondary | ICD-10-CM | POA: Diagnosis not present

## 2019-07-02 DIAGNOSIS — R972 Elevated prostate specific antigen [PSA]: Secondary | ICD-10-CM | POA: Diagnosis not present

## 2019-07-02 DIAGNOSIS — R3912 Poor urinary stream: Secondary | ICD-10-CM | POA: Diagnosis not present

## 2019-07-22 ENCOUNTER — Telehealth: Payer: Self-pay | Admitting: Cardiovascular Disease

## 2019-07-22 MED ORDER — DILTIAZEM HCL ER COATED BEADS 120 MG PO CP24: 120.0000 mg | ORAL_CAPSULE | Freq: Every day | ORAL | 1 refills | Status: DC

## 2019-07-22 NOTE — Telephone Encounter (Signed)
*  STAT* If patient is at the pharmacy, call can be transferred to refill team.   1. Which medications need to be refilled? (please list name of each medication and dose if known)   diltiazem (CARTIA XT) 120 MG 24 hr capsule     2. Which pharmacy/location (including street and city if local pharmacy) is medication to be sent to? [id number] - [place], [place] - [address]  3. Do they need a 30 day or 90 day supply? 90 day supply

## 2019-07-22 NOTE — Telephone Encounter (Signed)
I spoke with patient. He is due for follow up in late April but would like to wait until July for appointment. This schedule is not available  He will call in May to schedule this.  Will send refill for Diltiazem for 90 days with one refill to [facility].

## 2019-07-31 DIAGNOSIS — R972 Elevated prostate specific antigen [PSA]: Secondary | ICD-10-CM | POA: Diagnosis not present

## 2019-08-15 DIAGNOSIS — R972 Elevated prostate specific antigen [PSA]: Secondary | ICD-10-CM | POA: Diagnosis not present

## 2019-08-15 DIAGNOSIS — R3912 Poor urinary stream: Secondary | ICD-10-CM | POA: Diagnosis not present

## 2019-08-15 DIAGNOSIS — N401 Enlarged prostate with lower urinary tract symptoms: Secondary | ICD-10-CM | POA: Diagnosis not present

## 2019-08-15 DIAGNOSIS — N281 Cyst of kidney, acquired: Secondary | ICD-10-CM | POA: Diagnosis not present

## 2019-08-19 DIAGNOSIS — M25562 Pain in left knee: Secondary | ICD-10-CM | POA: Diagnosis not present

## 2019-10-18 ENCOUNTER — Ambulatory Visit (INDEPENDENT_AMBULATORY_CARE_PROVIDER_SITE_OTHER): Payer: Medicare Other | Admitting: Cardiovascular Disease

## 2019-10-18 ENCOUNTER — Encounter: Payer: Self-pay | Admitting: Cardiovascular Disease

## 2019-10-18 ENCOUNTER — Other Ambulatory Visit: Payer: Self-pay

## 2019-10-18 VITALS — BP 116/80 | HR 46 | Ht 69.0 in | Wt 166.0 lb

## 2019-10-18 DIAGNOSIS — G4733 Obstructive sleep apnea (adult) (pediatric): Secondary | ICD-10-CM | POA: Diagnosis not present

## 2019-10-18 DIAGNOSIS — E782 Mixed hyperlipidemia: Secondary | ICD-10-CM

## 2019-10-18 DIAGNOSIS — I480 Paroxysmal atrial fibrillation: Secondary | ICD-10-CM

## 2019-10-18 MED ORDER — FLECAINIDE ACETATE 100 MG PO TABS: 200.0000 mg | ORAL_TABLET | ORAL | 0 refills | Status: AC | PRN

## 2019-10-18 MED ORDER — DILTIAZEM HCL ER COATED BEADS 120 MG PO CP24: 120.0000 mg | ORAL_CAPSULE | Freq: Every day | ORAL | 3 refills | Status: DC

## 2019-10-18 NOTE — Assessment & Plan Note (Signed)
History of PAF back in 2016 without recurrence.  He does take flecainide as "pill in the pocket".  He had several episodes of palpitations back in October and November but none since.

## 2019-10-18 NOTE — Progress Notes (Signed)
10/18/2019 [name]   [birth year]  [id number]  Primary Physician System, Pcp Not In Primary Cardiologist: Lorretta Harp MD Renae Gloss  HPI:  Robert [name] is a 64 y.o.  well-appearing Caucasian male who relocated from [place], [place] to [place] on 11/30/2013 to work at [employer].He has retired since I last saw him and currently lives in [place]. I last saw him for a virtual telemedicine video visit 08/10/2018. He has no cardiovascular risk factors. He was seen by Dr. Mare Ferrari on 05/27/14 for PAF. A GXT was normal. An echo showed normal LV systolic function, grade 2 diastolic dysfunction, moderate right and mild left atrial enlargement.I last saw him in the office 4/18/17His CHA2DSVASC2 score is 0 .He does get occasional episode of decreased exercise tolerance during long runs which he attributes to episodes of peak AF which have been documented in the past. He was not put on an oral anticoagulated because of his low score. He had a coronary calcium score performed last year which was 0. An event monitor showed isolated PVCs. He seesDr. Radford Pax for evaluation of obstructive sleep apnea which he apparently has and wears CPAP at night which benefits him.  He has limited caffeine from his diet.He has in the past limited his alcohol intake as well which has reduced his symptoms of palpitations. Since I saw him year ago he says he's had no palpitations. He has retired and currently lives in [place]. He did have a Myoview stress test performed 05/03/17 that was entirely normal with EF of 60% done because of "an abnormal EKG".  Since he retired his palpitations have almost completely resolved.   He did have several episodes of tachypalpitations back in October or November of last year but none since.  Since I saw him virtually a year ago he is remained stable.  He continues to exercise but not as vigorously since he has had some issues with his knees.   He denies chest pain or shortness of breath.   Current Meds  Medication Sig   aspirin 81 MG tablet Take 81 mg by mouth daily.   diltiazem (CARTIA XT) 120 MG 24 hr capsule Take 1 capsule (120 mg total) by mouth daily.   flecainide (TAMBOCOR) 100 MG tablet Take 2 tablets (200 mg total) by mouth as needed (only 1 time dose, and only if you go into atrial fibrillation/palpitations).   valACYclovir (VALTREX) 500 MG tablet Take 500 mg by mouth daily as needed (use as directed.). For break outs   [DISCONTINUED] diltiazem (CARTIA XT) 120 MG 24 hr capsule Take 1 capsule (120 mg total) by mouth daily.   [DISCONTINUED] flecainide (TAMBOCOR) 100 MG tablet Take 2 tablets (200 mg total) by mouth as needed (only 1 time dose, and only if you go into atrial fibrillation/palpitations).     No Known Allergies  Social History   Socioeconomic History   Marital status: Single    Spouse name: Not on file   Number of children: Not on file   Years of education: Not on file   Highest education level: Not on file  Occupational History   Not on file  Tobacco Use   Smoking status: Never Smoker   Smokeless tobacco: Never Used  Vaping Use   Vaping Use: Never used  Substance and Sexual Activity   Alcohol use: Yes    Comment: occasionally   Drug use: No   Sexual activity: Yes  Other Topics Concern  Not on file  Social History Narrative   Not on file   Social Determinants of Health   Financial Resource Strain:    Difficulty of Paying Living Expenses:   Food Insecurity:    Worried About Charity fundraiser in the Last Year:    Arboriculturist in the Last Year:   Transportation Needs:    Film/video editor (Medical):    Lack of Transportation (Non-Medical):   Physical Activity:    Days of Exercise per Week:    Minutes of Exercise per Session:   Stress:    Feeling of Stress :   Social Connections:    Frequency of Communication with Friends and Family:     Frequency of Social Gatherings with Friends and Family:    Attends Religious Services:    Active Member of Clubs or Organizations:    Attends Music therapist:    Marital Status:   Intimate Partner Violence:    Fear of Current or Ex-Partner:    Emotionally Abused:    Physically Abused:    Sexually Abused:      Review of Systems: General: negative for chills, fever, night sweats or weight changes.  Cardiovascular: negative for chest pain, dyspnea on exertion, edema, orthopnea, palpitations, paroxysmal nocturnal dyspnea or shortness of breath Dermatological: negative for rash Respiratory: negative for cough or wheezing Urologic: negative for hematuria Abdominal: negative for nausea, vomiting, diarrhea, bright red blood per rectum, melena, or hematemesis Neurologic: negative for visual changes, syncope, or dizziness All other systems reviewed and are otherwise negative except as noted above.    Blood pressure 116/80, pulse (!) 46, height _0  (1.753 m), weight 166 lb (75.3 kg), SpO2 98 %.  General appearance: alert and no distress Neck: no adenopathy, no carotid bruit, no JVD, supple, symmetrical, trachea midline and thyroid not enlarged, symmetric, no tenderness/mass/nodules Lungs: clear to auscultation bilaterally Heart: regular rate and rhythm, S1, S2 normal, no murmur, click, rub or gallop Extremities: extremities normal, atraumatic, no cyanosis or edema Pulses: 2+ and symmetric Skin: Skin color, texture, turgor normal. No rashes or lesions Neurologic: Alert and oriented X 3, normal strength and tone. Normal symmetric reflexes. Normal coordination and gait  EKG sinus bradycardia 46 with incomplete right bundle branch block.  I personally reviewed this EKG.  ASSESSMENT AND PLAN:   PAF (paroxysmal atrial fibrillation) History of PAF back in 2016 without recurrence.  He does take flecainide as "pill in the pocket".  He had several episodes of palpitations  back in October and November but none since.  OSA (obstructive sleep apnea) History of obstructive sleep apnea on CPAP.  Hyperlipidemia History of mild hyperlipidemia not on statin therapy with a lipid profile performed 12/26/2018 revealing total cholesterol 180, LDL 108 and HDL of 64.  He does have a history of coronary calcium score of zero back in 2016.  There is no indication for statin therapy at this time.      Lorretta Harp MD FACP,FACC,FAHA, [facility] 10/18/2019 11:28 AM

## 2019-10-18 NOTE — Assessment & Plan Note (Signed)
History of obstructive sleep apnea on CPAP. 

## 2019-10-18 NOTE — Assessment & Plan Note (Signed)
History of mild hyperlipidemia not on statin therapy with a lipid profile performed 12/26/2018 revealing total cholesterol 180, LDL 108 and HDL of 64.  He does have a history of coronary calcium score of zero back in 2016.  There is no indication for statin therapy at this time.

## 2019-10-18 NOTE — Patient Instructions (Signed)
Medication Instructions:  NO CHANGE *If you need a refill on your cardiac medications before your next appointment, please call your pharmacy*   Lab Work: If you have labs (blood work) drawn today and your tests are completely normal, you will receive your results only by: . MyChart Message (if you have MyChart) OR . A paper copy in the mail If you have any lab test that is abnormal or we need to change your treatment, we will call you to review the results.   Follow-Up: At [facility], you and your health needs are our priority.  As part of our continuing mission to provide you with exceptional heart care, we have created designated Provider Care Teams.  These Care Teams include your primary Cardiologist (physician) and Advanced Practice Providers (APPs -  Physician Assistants and Nurse Practitioners) who all work together to provide you with the care you need, when you need it.  We recommend signing up for the patient portal called "MyChart".  Sign up information is provided on this After Visit Summary.  MyChart is used to connect with patients for Virtual Visits (Telemedicine).  Patients are able to view lab/test results, encounter notes, upcoming appointments, etc.  Non-urgent messages can be sent to your provider as well.   To learn more about what you can do with MyChart, go to [email or web address].    Your next appointment:   12 month(s)  The format for your next appointment:   In Person  Provider:   Jonathan Berry, MD    

## 2019-11-22 DIAGNOSIS — I480 Paroxysmal atrial fibrillation: Secondary | ICD-10-CM

## 2019-11-25 ENCOUNTER — Telehealth: Payer: Self-pay

## 2019-11-25 NOTE — Telephone Encounter (Signed)
   [facility] Pre-operative Risk Assessment   Request for surgical clearance:  What type of surgery is being performed? L Knee Arthroscopy with Meniscectomy   1. When is this surgery scheduled? 11/28/19  2. What type of clearance is required (medical clearance vs. Pharmacy clearance to hold med vs. Both)? Both  3. Are there any medications that need to be held prior to surgery and how long? Aspirin- referring office is asking to hold Aspirin 5-7 days prior to surgery and asking if it can resume 1 day after surgery  4. Practice name and name of physician performing surgery? [facility] Dr. Aletha Halim  5.   What is the office phone number? [phone]   7.   What is the office fax number? [phone]  8.   Anesthesia type (None, local, MAC, general) ? General   Lettie Czarnecki B Aijah Lattner 11/25/2019, 1:24 PM  _________________________________________________________________   (provider comments below)

## 2019-11-25 NOTE — Telephone Encounter (Signed)
Legend Holdren 64 year old male would like to have left knee arthroscopy with meniscectomy.  He was last seen in the cardiology clinic on 10/18/2019.  He was doing well at that time.  He denied shortness of breath and chest pain.  Remained physically active.  May his aspirin be held 5-7 days prior to his surgery?  His PMH includes paroxysmal atrial fibrillation, normal GXT, echocardiogram showed normal LV function, grade 2 diastolic dysfunction, moderate right and mild left atrial enlargement, OSA.  Please direct your response to CV DIV preop pool.  Thank you for your help.  [name]. Konnie Noffsinger NP-C    11/25/2019, 2:12 PM [facility] [address] Office [phone] Fax [phone]

## 2019-11-25 NOTE — Telephone Encounter (Signed)
   Primary Cardiologist: Nanetta Batty, MD  Chart reviewed as part of pre-operative protocol coverage. Given past medical history and time since last visit, based on ACC/AHA guidelines, Robert [name] would be at acceptable risk for the planned procedure without further cardiovascular testing.   He may hold his aspirin for 7 days prior to his procedure.  Please resume as soon as hemostasis is achieved.  I will route this recommendation to the requesting party via Epic fax function and remove from pre-op pool.  Please call with questions.  [name]. Russell Quinney NP-C    11/25/2019, 4:18 PM [facility] [address] Office [phone] Fax [phone]

## 2019-11-25 NOTE — Telephone Encounter (Signed)
Okay to hold aspirin for 7 days prior to his orthopedic procedure.

## 2019-12-09 ENCOUNTER — Ambulatory Visit (HOSPITAL_COMMUNITY): Payer: Medicare Other | Attending: Cardiology

## 2019-12-09 ENCOUNTER — Other Ambulatory Visit: Payer: Self-pay

## 2019-12-09 DIAGNOSIS — I480 Paroxysmal atrial fibrillation: Secondary | ICD-10-CM | POA: Insufficient documentation

## 2019-12-09 LAB — ECHOCARDIOGRAM COMPLETE
Area-P 1/2: 3.3 cm2
S' Lateral: 3.4 cm

## 2019-12-15 NOTE — Progress Notes (Signed)
Virtual Visit via Telephone Note   This visit type was conducted due to national recommendations for restrictions regarding the COVID-19 Pandemic (e.g. social distancing) in an effort to limit this patient's exposure and mitigate transmission in our community.  Due to his co-morbid illnesses, this patient is at least at moderate risk for complications without adequate follow up.  This format is felt to be most appropriate for this patient at this time.  All issues noted in this document were discussed and addressed.  A limited physical exam was performed with this format.  Please refer to the patient's chart for his consent to telehealth for [facility].   Evaluation Performed:  Follow-up visit  This visit type was conducted due to national recommendations for restrictions regarding the COVID-19 Pandemic (e.g. social distancing).  This format is felt to be most appropriate for this patient at this time.  All issues noted in this document were discussed and addressed.  No physical exam was performed (except for noted visual exam findings with Video Visits).  Please refer to the patient's chart (MyChart message for video visits and phone note for telephone visits) for the patient's consent to telehealth for [facility].  Date:  12/16/2019   ID:  Robert [name], DOB 05-Dec-1955, MRN [id number]  Patient Location:  Home  Provider location:   [place]  PCP:  System, Pcp Not In  Cardiologist:  Nanetta Batty, MD Sleep Medicine:  Armanda Magic, MD Electrophysiologist:  None   Chief Complaint:  OSA  History of Present Illness:    Robert [name] is a 64 y.o. male who presents via audio/video conferencing for a telehealth visit today.    THis is a 64yo male with a history of PAF who was referred for sleep study due to paroxysmal atrial fibrillation. He was initially dx with very mild OSA with an AHI of 7.3 events per hour. Most events occurred during NREM sleep in the supine position. He  was referred to Dr. Lazarus Salines for ENT evaluation and nothing was recommended. He was then referred to Dr. Toni Arthurs for fitting of oral device. He was seen by Boyce Medici on 11/03/2016 and was noncompliant with his oral device due to intolerance from poor fit and jaw pain.  He was instructed to follow-up with Dr. Toni Arthurs.  He later decided that he could no longer use the oral device because of severe jaw pain.  He underwent home sleep study which showed moderate obstructive sleep apnea with an AHI of 22.7/h with oxygen saturations as low as 81%.  He subsequently underwent CPAP titration to 7 cm of water pressure but was placed on auto CPAP.   He had been doing well with his CPAP device and unfortunately his CPAP has been recalled as it is a Respironics.  He also has been having problems with it since April.  He wakes up to go to the bathroom at night and then says that it is too noisy and he cannot get back to sleep with it.   He tolerates the mask and feels the pressure is adequate.  He feels tired in the am because he has to get up to use the bathroom several times due to his prostate.   He denies any significant mouth or nasal dryness or nasal congestion.  He does not think that he snores.  He tells me he has not had any atrial fibrillation.     Prior CV studies:   The following studies were reviewed today:  PAP compliance download  Past Medical History:  Diagnosis Date  . OSA (obstructive sleep apnea) 09/16/2014  . PAF (paroxysmal atrial fibrillation) (HCC)    Past Surgical History:  Procedure Laterality Date  . HERNIA REPAIR    . NASAL SEPTUM SURGERY       Current Meds  Medication Sig  . aspirin 81 MG tablet Take 81 mg by mouth daily.  Marland Kitchen diltiazem (CARTIA XT) 120 MG 24 hr capsule Take 1 capsule (120 mg total) by mouth daily.  . flecainide (TAMBOCOR) 100 MG tablet Take 2 tablets (200 mg total) by mouth as needed (only 1 time dose, and only if you go into atrial fibrillation/palpitations).   . valACYclovir (VALTREX) 500 MG tablet Take 500 mg by mouth daily as needed (use as directed.). For break outs     Allergies:   Patient has no known allergies.   Social History   Tobacco Use  . Smoking status: Never Smoker  . Smokeless tobacco: Never Used  Vaping Use  . Vaping Use: Never used  Substance Use Topics  . Alcohol use: Yes    Comment: occasionally  . Drug use: No     Family Hx: The patient's family history includes Atrial fibrillation in his mother; Kidney disease in his father; Lung cancer in his father.  ROS:   Please see the history of present illness.     All other systems reviewed and are negative.   Labs/Other Tests and Data Reviewed:    Recent Labs: No results found for requested labs within last 8760 hours.   Recent Lipid Panel Lab Results  Component Value Date/Time   CHOL 167 06/13/2016 11:02 AM   TRIG 49 06/13/2016 11:02 AM   HDL 63 06/13/2016 11:02 AM   CHOLHDL 2.7 06/13/2016 11:02 AM   LDLCALC 94 06/13/2016 11:02 AM    Wt Readings from Last 3 Encounters:  12/16/19 162 lb (73.5 kg)  10/18/19 166 lb (75.3 kg)  08/10/18 162 lb (73.5 kg)     Objective:    Vital Signs:  BP 116/67   Pulse (!) 49   Ht 5\' 9"  (1.753 m)   Wt 162 lb (73.5 kg)   BMI 23.92 kg/m     ASSESSMENT & PLAN:    1. OSA -  He has had some problems with his device keeping him awake at night due to noise and also his Respironics device has been recalled.   -I will order a new ResMed CPAP device on auto from 4 to 18cm H2O with heated humidity and mask of choice -repeat download in 2 weeks -followup with me in 8 weeks  2.  PAF -Patient thinks he is maintaining NSR. -continue flecainide and Diltiazem XT 120mg  daily -no anticoagulation due to CHADS2VASC score of 1 (age>65)   Patient Risk:   After full review of this patient's clinical status, I feel that they are at least moderate risk at this time.  Time:   Today, I have spent 15 minutes directly with the  patient on video discussing medical problems including OSA.  We also reviewed the symptoms of COVID 19 and the ways to protect against contracting the virus with telehealth technology.   Medication Adjustments/Labs and Tests Ordered: Current medicines are reviewed at length with the patient today.  Concerns regarding medicines are outlined above.  Tests Ordered: No orders of the defined types were placed in this encounter.  Medication Changes: No orders of the defined types were placed in this encounter.   Disposition:  Follow up  in 1 year(s)  Signed, Armanda Magic, MD  12/16/2019 8:13 AM    [facility]

## 2019-12-16 ENCOUNTER — Other Ambulatory Visit: Payer: Self-pay

## 2019-12-16 ENCOUNTER — Encounter: Payer: Self-pay | Admitting: Cardiology

## 2019-12-16 ENCOUNTER — Telehealth (INDEPENDENT_AMBULATORY_CARE_PROVIDER_SITE_OTHER): Payer: Medicare Other | Admitting: Cardiology

## 2019-12-16 ENCOUNTER — Telehealth: Payer: Self-pay | Admitting: *Deleted

## 2019-12-16 VITALS — BP 116/67 | HR 49 | Ht 69.0 in | Wt 162.0 lb

## 2019-12-16 DIAGNOSIS — I480 Paroxysmal atrial fibrillation: Secondary | ICD-10-CM | POA: Diagnosis not present

## 2019-12-16 DIAGNOSIS — G4733 Obstructive sleep apnea (adult) (pediatric): Secondary | ICD-10-CM | POA: Diagnosis not present

## 2019-12-16 NOTE — Telephone Encounter (Signed)
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

## 2019-12-16 NOTE — Telephone Encounter (Signed)
New Resmed not ordered. Patients dme says patient is not eligible for a new machine until 04/25/2022 and they have purchased bacteria filters for their patients free of cost all they need to do is go in and ask for one and they can get one. Dme says if they have humidity they should replace the filter once a month.  Patient has been notified.

## 2019-12-16 NOTE — Telephone Encounter (Signed)
-----   Message from Quintella Reichert, MD sent at 12/16/2019  8:14 AM EDT ----- Please order a new CPAP device due to using a Respironics devcie and the recall  ResMed CPAP device on auto from 4 to 18cm H2O with heated humidity and mask of choice.  Please get a download 2 weeks later and followup with me in 8 weeks.  He lives at [facility] so he will need a new DME down there  [employer]

## 2019-12-18 NOTE — Telephone Encounter (Signed)
Ok please relay this info to the patient

## 2019-12-18 NOTE — Telephone Encounter (Signed)
Yes, I know but this is the only solution the dme has for their patients and they say it works. They have purchased bacteria filters for their patients free of cost all they need to do is go in and ask for one.

## 2019-12-19 NOTE — Telephone Encounter (Addendum)
Patient has changed to [facility]  in as they have some replacement machines. All paperwork has been faxed over. Pt is aware and agreeable to treatment.

## 2019-12-27 NOTE — Addendum Note (Signed)
Addended by: Reesa Chew on: 12/27/2019 02:20 PM   Modules accepted: Orders

## 2020-03-05 ENCOUNTER — Telehealth (INDEPENDENT_AMBULATORY_CARE_PROVIDER_SITE_OTHER): Payer: Medicare Other | Admitting: Cardiology

## 2020-03-05 ENCOUNTER — Encounter: Payer: Self-pay | Admitting: Cardiology

## 2020-03-05 ENCOUNTER — Other Ambulatory Visit: Payer: Self-pay

## 2020-03-05 VITALS — BP 119/73 | HR 45 | Ht 69.0 in | Wt 163.0 lb

## 2020-03-05 DIAGNOSIS — I480 Paroxysmal atrial fibrillation: Secondary | ICD-10-CM | POA: Diagnosis not present

## 2020-03-05 DIAGNOSIS — G4733 Obstructive sleep apnea (adult) (pediatric): Secondary | ICD-10-CM | POA: Diagnosis not present

## 2020-03-05 NOTE — Progress Notes (Signed)
Virtual Visit via Telephone Note   This visit type was conducted due to national recommendations for restrictions regarding the COVID-19 Pandemic (e.g. social distancing) in an effort to limit this patient's exposure and mitigate transmission in our community.  Due to his co-morbid illnesses, this patient is at least at moderate risk for complications without adequate follow up.  This format is felt to be most appropriate for this patient at this time.  All issues noted in this document were discussed and addressed.  A limited physical exam was performed with this format.  Please refer to the patient's chart for his consent to telehealth for [facility].   Evaluation Performed:  Follow-up visit  This visit type was conducted due to national recommendations for restrictions regarding the COVID-19 Pandemic (e.g. social distancing).  This format is felt to be most appropriate for this patient at this time.  All issues noted in this document were discussed and addressed.  No physical exam was performed (except for noted visual exam findings with Video Visits).  Please refer to the patient's chart (MyChart message for video visits and phone note for telephone visits) for the patient's consent to telehealth for [facility].  Date:  03/05/2020   ID:  Robert [name], DOB [birth year], MRN [id number]  Patient Location:  Home  Provider location:   [place]  PCP:  Pcp, No  Cardiologist:  Nanetta Batty, MD Sleep Medicine:  Armanda Magic, MD Electrophysiologist:  None   Chief Complaint:  OSA  History of Present Illness:    Robert [name] is a 64 y.o. male who presents via audio/video conferencing for a telehealth visit today.    THis is a 64yo male with a history of PAF who was referred for sleep study due to paroxysmal atrial fibrillation. He was initially dx with very mild OSA with an AHI of 7.3 events per hour. Most events occurred during NREM sleep in the supine position. He was  referred to Dr. Lazarus Salines for ENT evaluation and nothing was recommended. He was then referred to Dr. Toni Arthurs for fitting of oral device. He was seen by Boyce Medici on 11/03/2016 and was noncompliant with his oral device due to intolerance from poor fit and jaw pain.  He was instructed to follow-up with Dr. Toni Arthurs.  He later decided that he could no longer use the oral device because of severe jaw pain.  He underwent home sleep study which showed moderate obstructive sleep apnea with an AHI of 22.7/h with oxygen saturations as low as 81%.  He subsequently underwent CPAP titration to 7 cm of water pressure but was placed on auto CPAP.   When I saw him last he was having problems with his device keeping him up due to noise in the machine as well as it was a Respironics device which was recalled.  We ordered a new PAP device and he is now back for followup per insurance requirements to document compliance with new device.    He is doing well with his CPAP device and thinks that he has gotten used to it.  He tolerates the mask and feels the pressure is adequate.  Since going on CPAP he feels rested in the am and has no significant daytime sleepiness.  He denies any significant mouth or nasal dryness or nasal congestion.  He does not think that he snores.     Prior CV studies:   The following studies were reviewed today:  PAP compliance download  Past Medical History:  Diagnosis  Date  . OSA (obstructive sleep apnea) 09/16/2014  . PAF (paroxysmal atrial fibrillation) (HCC)    Past Surgical History:  Procedure Laterality Date  . HERNIA REPAIR    . NASAL SEPTUM SURGERY       No outpatient medications have been marked as taking for the 03/05/20 encounter (Appointment) with Quintella Reichert, MD.     Allergies:   Patient has no known allergies.   Social History   Tobacco Use  . Smoking status: Never Smoker  . Smokeless tobacco: Never Used  Vaping Use  . Vaping Use: Never used  Substance Use  Topics  . Alcohol use: Yes    Comment: occasionally  . Drug use: No     Family Hx: The patient's family history includes Atrial fibrillation in his mother; Kidney disease in his father; Lung cancer in his father.  ROS:   Please see the history of present illness.     All other systems reviewed and are negative.   Labs/Other Tests and Data Reviewed:    Recent Labs: No results found for requested labs within last 8760 hours.   Recent Lipid Panel Lab Results  Component Value Date/Time   CHOL 167 06/13/2016 11:02 AM   TRIG 49 06/13/2016 11:02 AM   HDL 63 06/13/2016 11:02 AM   CHOLHDL 2.7 06/13/2016 11:02 AM   LDLCALC 94 06/13/2016 11:02 AM    Wt Readings from Last 3 Encounters:  12/16/19 162 lb (73.5 kg)  10/18/19 166 lb (75.3 kg)  08/10/18 162 lb (73.5 kg)     Objective:    Vital Signs:  There were no vitals taken for this visit.    ASSESSMENT & PLAN:    1. OSA - The patient is tolerating PAP therapy well without any problems. The PAP download was reviewed today and showed an AHI of 3.5/hr on auto PAP  with 100% compliance in using more than 4 hours nightly.  The patient has been using and benefiting from PAP use and will continue to benefit from therapy.   2.  PAF -he has not had any palpitations -continue flecainide and Diltiazem XT 120mg  daily -no anticoagulation due to CHADS2VASC score of 1 (age>65)  Patient Risk:   After full review of this patient's clinical status, I feel that they are at least moderate risk at this time.  Time:   Today, I have spent 15 minutes directly with the patient on video discussing medical problems including OSA.    Medication Adjustments/Labs and Tests Ordered: Current medicines are reviewed at length with the patient today.  Concerns regarding medicines are outlined above.  Tests Ordered: No orders of the defined types were placed in this encounter.  Medication Changes: No orders of the defined types were placed in this  encounter.   Disposition:  Follow up in 1 year(s)  Signed, , MD  03/05/2020 9:19 AM    [facility]

## 2020-03-05 NOTE — Patient Instructions (Signed)

## 2020-09-21 ENCOUNTER — Telehealth: Payer: Self-pay | Admitting: Cardiovascular Disease

## 2020-09-21 MED ORDER — DILTIAZEM HCL ER COATED BEADS 120 MG PO CP24: 120.0000 mg | ORAL_CAPSULE | Freq: Every day | ORAL | 0 refills | Status: DC

## 2020-09-21 NOTE — Telephone Encounter (Signed)
*  STAT* If patient is at the pharmacy, call can be transferred to refill team.   1. Which medications need to be refilled? (please list name of each medication and dose if known)  diltiazem (CARTIA XT) 120 MG 24 hr capsule  2. Which pharmacy/location (including street and city if local pharmacy) is medication to be sent to? [id number] [place], [place] - [address]  3. Do they need a 30 day or 90 day supply? 90  Patient is scheduled to see Dr. Allyson Sabal 7.8.22 but will not have enough medication to last him until his appt

## 2020-10-23 ENCOUNTER — Encounter: Payer: Self-pay | Admitting: Cardiovascular Disease

## 2020-10-23 ENCOUNTER — Ambulatory Visit (INDEPENDENT_AMBULATORY_CARE_PROVIDER_SITE_OTHER): Payer: Medicare Other | Admitting: Cardiovascular Disease

## 2020-10-23 ENCOUNTER — Ambulatory Visit (HOSPITAL_COMMUNITY)
Admission: RE | Admit: 2020-10-23 | Discharge: 2020-10-23 | Disposition: A | Discharge status: Home / Self Care | Payer: Medicare Other | Source: Ambulatory Visit | Attending: Cardiology | Admitting: Cardiology

## 2020-10-23 ENCOUNTER — Other Ambulatory Visit: Payer: Self-pay

## 2020-10-23 VITALS — BP 128/71 | HR 60 | Ht 69.0 in | Wt 164.6 lb

## 2020-10-23 DIAGNOSIS — R0989 Other specified symptoms and signs involving the circulatory and respiratory systems: Secondary | ICD-10-CM | POA: Diagnosis not present

## 2020-10-23 DIAGNOSIS — R931 Abnormal findings on diagnostic imaging of heart and coronary circulation: Secondary | ICD-10-CM | POA: Diagnosis not present

## 2020-10-23 DIAGNOSIS — E782 Mixed hyperlipidemia: Secondary | ICD-10-CM

## 2020-10-23 DIAGNOSIS — I480 Paroxysmal atrial fibrillation: Secondary | ICD-10-CM

## 2020-10-23 NOTE — Assessment & Plan Note (Signed)
History of obstructive sleep apnea on CPAP. 

## 2020-10-23 NOTE — Assessment & Plan Note (Signed)
Coronary calcium score in 2016 was 0.  Recent coronary calcium score performed 01/09/2020 was 9 with a small amount of calcium in the LAD and RCA.  He is completely asymptomatic.

## 2020-10-23 NOTE — Patient Instructions (Signed)
Medication Instructions:  No Changes In Medications at this time.  *If you need a refill on your cardiac medications before your next appointment, please call your pharmacy*  Testing/Procedures: Your physician has requested that you have a carotid duplex IN [place] . This test is an ultrasound of the carotid arteries in your neck. It looks at blood flow through these arteries that supply the brain with blood. Allow one hour for this exam. There are no restrictions or special instructions.  Follow-Up: At [facility], you and your health needs are our priority.  As part of our continuing mission to provide you with exceptional heart care, we have created designated Provider Care Teams.  These Care Teams include your primary Cardiologist (physician) and Advanced Practice Providers (APPs -  Physician Assistants and Nurse Practitioners) who all work together to provide you with the care you need, when you need it.  Your next appointment:   1 year(s)  The format for your next appointment:   In Person  Provider:   Nanetta Batty, MD

## 2020-10-23 NOTE — Assessment & Plan Note (Signed)
Patient has a right carotid bruit on exam today.  We will check carotid Doppler studies.

## 2020-10-23 NOTE — Assessment & Plan Note (Signed)
History of hyperlipidemia on low-dose rosuvastatin with lipid profile that was improved recently checked 07/08/2020 by his PCP revealing total cholesterol 29, LDL of 80 and HDL of 59.

## 2020-10-23 NOTE — Assessment & Plan Note (Signed)
History of PAF without recurrence since 2016.  He is not on oral anticoagulant because of low CHA2DS2-VASc score of 0.  He does take diltiazem low-dose and has flecainide "pill in the pocket" but has never used it.

## 2020-10-23 NOTE — Progress Notes (Signed)
10/23/2020 Adonis Yim   10-20-1955  [id number]  Primary Physician Theodoro Kalata, MD Primary Cardiologist: Lorretta Harp MD Renae Gloss  HPI:  Robert [name] is a 65 y.o.  well-appearing Caucasian male who relocated from [place], [place] to [place] on 11/30/2013 to work at [employer]. He has retired since I last saw him and currently lives in [place]. I last saw him in the office 10/18/2019.  He has no cardiovascular risk factors. He was seen by Dr. Mare Ferrari on 05/27/14 for PAF. A GXT was normal. An echo showed normal LV systolic function, grade 2 diastolic dysfunction, moderate right and mild left atrial enlargement. I last saw him in the office 08/04/15 His CHA2DSVASC2 score is  0 .He does get occasional episode of decreased exercise tolerance during long runs which he attributes to episodes of peak AF which have been documented in the past. He was not put on an oral anticoagulated because of his low score. He had a coronary calcium score performed last year which was 0. An event monitor showed isolated PVCs. He sees Dr. Radford Pax for evaluation of obstructive sleep apnea which he apparently has and wears CPAP at night which benefits him.  He has limited caffeine from his diet. He has in the past limited his alcohol intake as well which has reduced his symptoms of palpitations.  He has retired and currently lives in [place]. He did have a Myoview stress test performed 05/03/17 that was entirely normal with EF of 60% done because of "an abnormal EKG".  Since he retired his palpitations have almost completely resolved.    He did have several episodes of tachypalpitations back in October or November of last year but none since.   Since I saw him in the office a year ago he continues to do well.  He is completely asymptomatic.  He has not had an A. fib recurrence since 2016.  He denies chest pain or shortness of breath.   Current Meds  Medication Sig    aspirin 81 MG tablet Take 81 mg by mouth daily.   diltiazem (CARTIA XT) 120 MG 24 hr capsule Take 1 capsule (120 mg total) by mouth daily.   finasteride (PROSCAR) 5 MG tablet Take 5 mg by mouth daily.   flecainide (TAMBOCOR) 100 MG tablet Take 2 tablets (200 mg total) by mouth as needed (only 1 time dose, and only if you go into atrial fibrillation/palpitations).   rosuvastatin (CRESTOR) 5 MG tablet Take 5 mg by mouth at bedtime.   tadalafil (CIALIS) 5 MG tablet Take 5 mg by mouth daily.   valACYclovir (VALTREX) 500 MG tablet Take 500 mg by mouth daily as needed (use as directed.). For break outs     No Known Allergies  Social History   Socioeconomic History   Marital status: Single    Spouse name: Not on file   Number of children: Not on file   Years of education: Not on file   Highest education level: Not on file  Occupational History   Not on file  Tobacco Use   Smoking status: Never   Smokeless tobacco: Never  Vaping Use   Vaping Use: Never used  Substance and Sexual Activity   Alcohol use: Yes    Comment: occasionally   Drug use: No   Sexual activity: Yes  Other Topics Concern   Not on file  Social History Narrative   Not on file  Social Determinants of Health   Financial Resource Strain: Not on file  Food Insecurity: Not on file  Transportation Needs: Not on file  Physical Activity: Not on file  Stress: Not on file  Social Connections: Not on file  Intimate Partner Violence: Not on file     Review of Systems: General: negative for chills, fever, night sweats or weight changes.  Cardiovascular: negative for chest pain, dyspnea on exertion, edema, orthopnea, palpitations, paroxysmal nocturnal dyspnea or shortness of breath Dermatological: negative for rash Respiratory: negative for cough or wheezing Urologic: negative for hematuria Abdominal: negative for nausea, vomiting, diarrhea, bright red blood per rectum, melena, or hematemesis Neurologic: negative for  visual changes, syncope, or dizziness All other systems reviewed and are otherwise negative except as noted above.    Blood pressure 128/71, pulse 60, height _0  (1.753 m), weight 164 lb 9.6 oz (74.7 kg), SpO2 97 %.  General appearance: alert and no distress Neck: no adenopathy, no carotid bruit, no JVD, supple, symmetrical, trachea midline, and thyroid not enlarged, symmetric, no tenderness/mass/nodules Lungs: clear to auscultation bilaterally Heart: regular rate and rhythm, S1, S2 normal, no murmur, click, rub or gallop Extremities: extremities normal, atraumatic, no cyanosis or edema Pulses: 2+ and symmetric Skin: Skin color, texture, turgor normal. No rashes or lesions Neurologic: Grossly normal  EKG sinus rhythm at 60 with incomplete right bundle branch block.  I personally reviewed this EKG.  ASSESSMENT AND PLAN:   PAF (paroxysmal atrial fibrillation) ([place]) History of PAF without recurrence since 2016.  He is not on oral anticoagulant because of low CHA2DS2-VASc score of 0.  He does take diltiazem low-dose and has flecainide "pill in the pocket" but has never used it.  OSA (obstructive sleep apnea) History of obstructive sleep apnea on CPAP.  Hyperlipidemia History of hyperlipidemia on low-dose rosuvastatin with lipid profile that was improved recently checked 07/08/2020 by his PCP revealing total cholesterol 29, LDL of 80 and HDL of 59.  Elevated coronary artery calcium score Coronary calcium score in 2016 was 0.  Recent coronary calcium score performed 01/09/2020 was 9 with a small amount of calcium in the LAD and RCA.  He is completely asymptomatic.  Right carotid bruit Patient has a right carotid bruit on exam today.  We will check carotid Doppler studies.     Lorretta Harp MD FACP,FACC,FAHA, [facility] 10/23/2020 1:53 PM

## 2020-12-17 ENCOUNTER — Other Ambulatory Visit: Payer: Self-pay | Admitting: Cardiovascular Disease

## 2021-03-05 ENCOUNTER — Encounter: Payer: Self-pay | Admitting: Cardiology

## 2021-03-05 ENCOUNTER — Telehealth (INDEPENDENT_AMBULATORY_CARE_PROVIDER_SITE_OTHER): Payer: Medicare Other | Admitting: Cardiology

## 2021-03-05 ENCOUNTER — Other Ambulatory Visit: Payer: Self-pay

## 2021-03-05 VITALS — BP 115/66 | HR 48 | Ht 69.0 in | Wt 162.0 lb

## 2021-03-05 DIAGNOSIS — R931 Abnormal findings on diagnostic imaging of heart and coronary circulation: Secondary | ICD-10-CM | POA: Diagnosis not present

## 2021-03-05 DIAGNOSIS — G4733 Obstructive sleep apnea (adult) (pediatric): Secondary | ICD-10-CM

## 2021-03-05 DIAGNOSIS — I480 Paroxysmal atrial fibrillation: Secondary | ICD-10-CM | POA: Diagnosis not present

## 2021-03-05 NOTE — Patient Instructions (Signed)
Medication Instructions:  Your physician recommends that you continue on your current medications as directed. Please refer to the Current Medication list given to you today.  *If you need a refill on your cardiac medications before your next appointment, please call your pharmacy*   Lab Work: None ordered  If you have labs (blood work) drawn today and your tests are completely normal, you will receive your results only by: MyChart Message (if you have MyChart) OR A paper copy in the mail If you have any lab test that is abnormal or we need to change your treatment, we will call you to review the results.   Testing/Procedures: None ordered   Follow-Up: At [facility], you and your health needs are our priority.  As part of our continuing mission to provide you with exceptional heart care, we have created designated Provider Care Teams.  These Care Teams include your primary Cardiologist (physician) and Advanced Practice Providers (APPs -  Physician Assistants and Nurse Practitioners) who all work together to provide you with the care you need, when you need it.  We recommend signing up for the patient portal called "MyChart".  Sign up information is provided on this After Visit Summary.  MyChart is used to connect with patients for Virtual Visits (Telemedicine).  Patients are able to view lab/test results, encounter notes, upcoming appointments, etc.  Non-urgent messages can be sent to your provider as well.   To learn more about what you can do with MyChart, go to [email or web address].    Your next appointment:   12 month(s)  The format for your next appointment:   In Person  Provider:   Dr. Mayford Knife for Sleep     Other Instructions

## 2021-03-05 NOTE — Progress Notes (Signed)
Virtual Visit via Video Note   This visit type was conducted due to national recommendations for restrictions regarding the COVID-19 Pandemic (e.g. social distancing) in an effort to limit this patient's exposure and mitigate transmission in our community.  Due to his co-morbid illnesses, this patient is at least at moderate risk for complications without adequate follow up.  This format is felt to be most appropriate for this patient at this time.  All issues noted in this document were discussed and addressed.  A limited physical exam was performed with this format.  Please refer to the patient's chart for his consent to telehealth for [facility].   Date:  03/05/2021   ID:  Robert [name], DOB 1955/05/27, MRN [id number] The patient was identified using 2 identifiers.  Patient Location: Home Provider Location: Office/Clinic  ID:  Jahquan Klugh, DOB 1955/05/21, MRN [id number]  PCP:  Worthy Keeler, MD  Cardiologist:  Nanetta Batty, MD Sleep Medicine:  Armanda Magic, MD Electrophysiologist:  None   Chief Complaint:  OSA  History of Present Illness:    Robert [name] is a 65 y.o. male  with a history of PAF who was referred for sleep study due to paroxysmal atrial fibrillation. He was initially dx with very mild OSA with an AHI of 7.3 events per hour.  Most events occurred during NREM sleep in the supine position.  He was referred to Dr. Lazarus Salines for ENT evaluation and nothing was recommended.  He was then referred to Dr. Toni Arthurs for fitting of oral device.  He was seen by Boyce Medici on 11/03/2016 and was noncompliant with his oral device due to intolerance from poor fit and jaw pain.  He was instructed to follow-up with Dr. Toni Arthurs.  He later decided that he could no longer use the oral device because of severe jaw pain.  He underwent home sleep study which showed moderate obstructive sleep apnea with an AHI of 22.7/h with oxygen saturations as low as 81%.  He subsequently underwent  CPAP titration to 7 cm of water pressure but was placed on auto CPAP.   He is doing well with his CPAP device and thinks that he has gotten used to it.  He tolerates the mask and feels the pressure is adequate.  Since going on CPAP he feels rested in the am and has no significant daytime sleepiness.  He denies any significant mouth or nasal dryness or nasal congestion.  He does not think that he snores.     Prior CV studies:   The following studies were reviewed today:  PAP compliance download  Past Medical History:  Diagnosis Date   OSA (obstructive sleep apnea) 09/16/2014   PAF (paroxysmal atrial fibrillation) (HCC)    Past Surgical History:  Procedure Laterality Date   HERNIA REPAIR     NASAL SEPTUM SURGERY       Current Meds  Medication Sig   aspirin 81 MG tablet Take 81 mg by mouth daily.   diltiazem (CARDIZEM CD) 120 MG 24 hr capsule Take 1 capsule (120 mg total) by mouth daily.   finasteride (PROSCAR) 5 MG tablet Take 5 mg by mouth daily.   flecainide (TAMBOCOR) 100 MG tablet Take 2 tablets (200 mg total) by mouth as needed (only 1 time dose, and only if you go into atrial fibrillation/palpitations).   rosuvastatin (CRESTOR) 5 MG tablet Take 5 mg by mouth at bedtime.   tadalafil (CIALIS) 5 MG tablet Take 5 mg by mouth daily.   valACYclovir (  VALTREX) 500 MG tablet Take 500 mg by mouth daily as needed (use as directed.). For break outs     Allergies:   Patient has no known allergies.   Social History   Tobacco Use   Smoking status: Never   Smokeless tobacco: Never  Vaping Use   Vaping Use: Never used  Substance Use Topics   Alcohol use: Yes    Comment: occasionally   Drug use: No     Family Hx: The patient's family history includes Atrial fibrillation in his mother; Kidney disease in his father; Lung cancer in his father.  ROS:   Please see the history of present illness.     All other systems reviewed and are negative.   Labs/Other Tests and Data Reviewed:     Recent Labs: No results found for requested labs within last 8760 hours.   Recent Lipid Panel Lab Results  Component Value Date/Time   CHOL 167 06/13/2016 11:02 AM   TRIG 49 06/13/2016 11:02 AM   HDL 63 06/13/2016 11:02 AM   CHOLHDL 2.7 06/13/2016 11:02 AM   LDLCALC 94 06/13/2016 11:02 AM    Wt Readings from Last 3 Encounters:  03/05/21 162 lb (73.5 kg)  10/23/20 164 lb 9.6 oz (74.7 kg)  03/05/20 163 lb (73.9 kg)     Objective:    Vital Signs:  BP 115/66   Pulse (!) 48   Ht 5\' 9"  (1.753 m)   Wt 162 lb (73.5 kg)   SpO2 98%   BMI 23.92 kg/m    Well nourished, well developed male in no acute distress. Well appearing, alert and conversant, regular work of breathing,  good skin color  Eyes- anicteric mouth- oral mucosa is pink  neuro- grossly intact skin- no apparent rash or lesions or cyanosis   ASSESSMENT & PLAN:    1. OSA - The patient is tolerating PAP therapy well without any problems. The PAP download performed by his DME was personally reviewed and interpreted by me today and showed an AHI of 2.3/hr on auto PAP with 97% compliance in using more than 4 hours nightly.  The patient has been using and benefiting from PAP use and will continue to benefit from therapy.    2.  PAF -He is maintaining NSR on exam with no palpitations -continue flecainide PRN and Cardizem XT 120mg  daily with PRN refills -no anticoagulation due to CHADS2VASC score of 1 (age>65)  COVID-19 Education: The signs and symptoms of COVID-19 were discussed with the patient and how to seek care for testing (follow up with PCP or arrange E-visit).  The importance of social distancing was discussed today.  Time:   Today, I have spent 15 minutes with the patient with telehealth technology discussing the above problems.    Medication Adjustments/Labs and Tests Ordered: Current medicines are reviewed at length with the patient today.  Concerns regarding medicines are outlined above.  Tests  Ordered: No orders of the defined types were placed in this encounter.  Medication Changes: No orders of the defined types were placed in this encounter.   Disposition:  Follow up in 1 year(s)  Signed, , MD  03/05/2021 8:01 AM    [facility]

## 2021-03-16 ENCOUNTER — Other Ambulatory Visit: Payer: Self-pay | Admitting: Cardiovascular Disease

## 2021-03-25 ENCOUNTER — Telehealth: Payer: Self-pay

## 2021-03-25 NOTE — Telephone Encounter (Signed)
   [facility] Pre-operative Risk Assessment    Patient Name: Robert [name]  DOB: 24-May-1955 MRN: [id number]  HEARTCARE STAFF:  - IMPORTANT!!!!!! Under Visit Info/Reason for Call, type in Other and utilize the format Clearance MM/DD/YY or Clearance TBD. Do not use dashes or single digits. - Please review there is not already an duplicate clearance open for this procedure. - If request is for dental extraction, please clarify the # of teeth to be extracted. - If the patient is currently at the dentist's office, call Pre-Op Callback Staff (MA/nurse) to input urgent request.  - If the patient is not currently in the dentist office, please route to the Pre-Op pool.  Request for surgical clearance:  What type of surgery is being performed? Right Gastrocnemius recession with distal tarsal tunnel release and partial plantar fasciectomy   When is this surgery scheduled? TBD  What type of clearance is required (medical clearance vs. Pharmacy clearance to hold med vs. Both)? Both   Are there any medications that need to be held prior to surgery and how long? ASA  Practice name and name of physician performing surgery? [facility], Dr. Mendel Ryder  What is the office phone number? [phone]   7.   What is the office fax number? [phone]  8.   Anesthesia type (None, local, MAC, general) ? Not listed    Jacqulynn Cadet 03/25/2021, 4:52 PM  _________________________________________________________________   (provider comments below)

## 2021-03-26 NOTE — Telephone Encounter (Signed)
   Name: Robert [name]  DOB: 01/16/1956  MRN: [id number]   Primary Cardiologist: Nanetta Batty, MD / OSA - Dr. Mayford Knife  Chart reviewed as part of pre-operative protocol coverage. Patient was contacted 03/26/2021 in reference to pre-operative risk assessment for pending surgery as outlined below.  Alastor Kneale was last seen on 02/2021 by Dr. Mayford Knife virtually for OSA and 10/2020 by Dr. Allyson Sabal. History reviewed, primary issue is remote atrial fib. Prior ischemic workup includes remotely normal ETT 2016 and normal nuc 2019. Echo in 2019 with normal EF. Carotid duplex with no evidence of stenosis in the R/LICA. I reached out to patient for update on how he is doing. The patient affirms he has been doing well without any new cardiac symptoms. He states he is running 4-5 miles a day. Therefore, based on ACC/AHA guidelines, the patient would be at acceptable risk for the planned procedure without further cardiovascular testing. The patient was advised that if he develops new symptoms prior to surgery to contact our office to arrange for a follow-up visit, and he verbalized understanding.  He denies prior hx of TIA, stroke, MI, PCI, CABG. Therefore OK to hold ASA 7 days prior to surgery if needed.  I will route this recommendation to the requesting party via Epic fax function and remove from pre-op pool. Please call with questions.  Laurann Montana, PA-C 03/26/2021, 11:34 AM

## 2021-10-29 ENCOUNTER — Encounter: Payer: Self-pay | Admitting: Cardiovascular Disease

## 2021-10-29 ENCOUNTER — Ambulatory Visit (INDEPENDENT_AMBULATORY_CARE_PROVIDER_SITE_OTHER): Payer: Medicare Other | Admitting: Cardiovascular Disease

## 2021-10-29 VITALS — BP 121/74 | HR 41 | Ht 69.0 in | Wt 159.6 lb

## 2021-10-29 DIAGNOSIS — G4733 Obstructive sleep apnea (adult) (pediatric): Secondary | ICD-10-CM | POA: Diagnosis not present

## 2021-10-29 DIAGNOSIS — R001 Bradycardia, unspecified: Secondary | ICD-10-CM | POA: Insufficient documentation

## 2021-10-29 DIAGNOSIS — R0989 Other specified symptoms and signs involving the circulatory and respiratory systems: Secondary | ICD-10-CM

## 2021-10-29 DIAGNOSIS — E782 Mixed hyperlipidemia: Secondary | ICD-10-CM | POA: Diagnosis not present

## 2021-10-29 DIAGNOSIS — R931 Abnormal findings on diagnostic imaging of heart and coronary circulation: Secondary | ICD-10-CM | POA: Diagnosis not present

## 2021-10-29 DIAGNOSIS — I480 Paroxysmal atrial fibrillation: Secondary | ICD-10-CM

## 2021-10-29 NOTE — Assessment & Plan Note (Signed)
History of hyperlipidemia on statin therapy lipid profile performed 07/02/2021 revealing total cholesterol 143, LDL of 78 and HDL of 44.

## 2021-10-29 NOTE — Assessment & Plan Note (Signed)
EKG today shows sinus bradycardia 41.  He is very active and runs 25 miles a week.  I suspect this is related to cardiovascular conditioning.  He is asymptomatic from this.

## 2021-10-29 NOTE — Assessment & Plan Note (Signed)
Carotid Dopplers performed 10/24/2020 were normal.

## 2021-10-29 NOTE — Patient Instructions (Signed)

## 2021-10-29 NOTE — Progress Notes (Signed)
10/29/2021 [name]   [birth year]  [id number]  Primary Physician Theodoro Kalata, MD Primary Cardiologist: Lorretta Harp MD Renae Gloss  HPI:  Robert [name] is a 66 y.o.   well-appearing Caucasian male who relocated from [place], [place] to [place] on 11/30/2013 to work at [employer]. He has retired since I last saw him and currently lives in [place]. I last saw him in the office 10/23/2020.  He has no cardiovascular risk factors. He was seen by Dr. Mare Ferrari on 05/27/14 for PAF. A GXT was normal. An echo showed normal LV systolic function, grade 2 diastolic dysfunction, moderate right and mild left atrial enlargement. I last saw him in the office 08/04/15 His CHA2DSVASC2 score is  0 .He does get occasional episode of decreased exercise tolerance during long runs which he attributes to episodes of peak AF which have been documented in the past. He was not put on an oral anticoagulated because of his low score. He had a coronary calcium score performed last year which was 0. An event monitor showed isolated PVCs. He sees Dr. Radford Pax for evaluation of obstructive sleep apnea which he apparently has and wears CPAP at night which benefits him.  He has limited caffeine from his diet. He has in the past limited his alcohol intake as well which has reduced his symptoms of palpitations.   He has retired and currently lives in [place]. He did have a Myoview stress test performed 05/03/17 that was entirely normal with EF of 60% done because of "an abnormal EKG".  Since he retired his palpitations have almost completely resolved.    He did have several episodes of tachypalpitations back in October or November of last year but none since.   Since I saw him in the office a year ago he continues to do well.  He is completely asymptomatic.  He has not had an A. fib recurrence since 2016.  He denies chest pain or shortness of breath.  His heart rate is in the 40s today.  He  is running 25 miles a week but intends to run more.  He recently had Mohs surgery on his right neck (basal cell).   Current Meds  Medication Sig   aspirin 81 MG tablet Take 81 mg by mouth daily.   diltiazem (CARDIZEM CD) 120 MG 24 hr capsule TAKE 1 CAPSULE BY MOUTH EVERY DAY   finasteride (PROSCAR) 5 MG tablet Take 5 mg by mouth daily.   flecainide (TAMBOCOR) 100 MG tablet Take 2 tablets (200 mg total) by mouth as needed (only 1 time dose, and only if you go into atrial fibrillation/palpitations).   rosuvastatin (CRESTOR) 5 MG tablet Take 5 mg by mouth at bedtime.   tadalafil (CIALIS) 5 MG tablet Take 5 mg by mouth daily.   valACYclovir (VALTREX) 500 MG tablet Take 500 mg by mouth daily as needed (use as directed.). For break outs     No Known Allergies  Social History   Socioeconomic History   Marital status: Single    Spouse name: Not on file   Number of children: Not on file   Years of education: Not on file   Highest education level: Not on file  Occupational History   Not on file  Tobacco Use   Smoking status: Never   Smokeless tobacco: Never  Vaping Use   Vaping Use: Never used  Substance and Sexual Activity   Alcohol use: Yes  Comment: occasionally   Drug use: No   Sexual activity: Yes  Other Topics Concern   Not on file  Social History Narrative   Not on file   Social Determinants of Health   Financial Resource Strain: Not on file  Food Insecurity: Not on file  Transportation Needs: Not on file  Physical Activity: Not on file  Stress: Not on file  Social Connections: Not on file  Intimate Partner Violence: Not on file     Review of Systems: General: negative for chills, fever, night sweats or weight changes.  Cardiovascular: negative for chest pain, dyspnea on exertion, edema, orthopnea, palpitations, paroxysmal nocturnal dyspnea or shortness of breath Dermatological: negative for rash Respiratory: negative for cough or wheezing Urologic: negative  for hematuria Abdominal: negative for nausea, vomiting, diarrhea, bright red blood per rectum, melena, or hematemesis Neurologic: negative for visual changes, syncope, or dizziness All other systems reviewed and are otherwise negative except as noted above.    Blood pressure 121/74, pulse (!) 41, height _0  (1.753 m), weight 159 lb 9.6 oz (72.4 kg), SpO2 98 %.  General appearance: alert and no distress Neck: no adenopathy, no carotid bruit, no JVD, supple, symmetrical, trachea midline, and thyroid not enlarged, symmetric, no tenderness/mass/nodules Lungs: clear to auscultation bilaterally Heart: Soft outflow tract murmur Extremities: extremities normal, atraumatic, no cyanosis or edema Pulses: 2+ and symmetric Skin: Skin color, texture, turgor normal. No rashes or lesions Neurologic: Grossly normal  EKG sinus bradycardia 41 with left axis deviation.  I personally reviewed this EKG.  There did appear to be Q waves in the lateral leads, unchanged from prior EKGs.  ASSESSMENT AND PLAN:   PAF (paroxysmal atrial fibrillation) (HCC) History of PAF in the past.  None since. The CHA2DSVASC2 score is 0  .  OSA (obstructive sleep apnea) History of obstructive sleep apnea on CPAP which she benefits from.  He is followed by Dr. Radford Pax.  Hyperlipidemia History of hyperlipidemia on statin therapy lipid profile performed 07/02/2021 revealing total cholesterol 143, LDL of 78 and HDL of 44.  Right carotid bruit Carotid Dopplers performed 10/24/2020 were normal.  Sinus bradycardia EKG today shows sinus bradycardia 41.  He is very active and runs 25 miles a week.  I suspect this is related to cardiovascular conditioning.  He is asymptomatic from this.     Lorretta Harp MD FACP,FACC,FAHA, [facility] 10/29/2021 11:40 AM

## 2021-10-29 NOTE — Assessment & Plan Note (Signed)
History of obstructive sleep apnea on CPAP which she benefits from.  He is followed by Dr. Mayford Knife.

## 2021-10-29 NOTE — Assessment & Plan Note (Signed)
History of PAF in the past.  None since. The CHA2DSVASC2 score is 0  .

## 2022-03-03 ENCOUNTER — Other Ambulatory Visit: Payer: Self-pay | Admitting: Cardiovascular Disease

## 2022-03-03 ENCOUNTER — Encounter: Payer: Self-pay | Admitting: Cardiology

## 2022-03-03 ENCOUNTER — Ambulatory Visit: Payer: Medicare Other | Attending: Cardiology | Admitting: Cardiology

## 2022-03-03 VITALS — BP 124/69 | HR 45 | Ht 69.0 in | Wt 160.0 lb

## 2022-03-03 DIAGNOSIS — G4733 Obstructive sleep apnea (adult) (pediatric): Secondary | ICD-10-CM

## 2022-03-03 DIAGNOSIS — R931 Abnormal findings on diagnostic imaging of heart and coronary circulation: Secondary | ICD-10-CM | POA: Diagnosis not present

## 2022-03-03 DIAGNOSIS — I480 Paroxysmal atrial fibrillation: Secondary | ICD-10-CM | POA: Diagnosis not present

## 2022-03-03 NOTE — Progress Notes (Signed)
Virtual Visit via Video Note   This visit type was conducted due to national recommendations for restrictions regarding the COVID-19 Pandemic (e.g. social distancing) in an effort to limit this patient's exposure and mitigate transmission in our community.  Due to his co-morbid illnesses, this patient is at least at moderate risk for complications without adequate follow up.  This format is felt to be most appropriate for this patient at this time.  All issues noted in this document were discussed and addressed.  A limited physical exam was performed with this format.  Please refer to the patient's chart for his consent to telehealth for [facility].   Date:  03/03/2022   ID:  Robert [name], DOB 11-Dec-1955, MRN [id number] The patient was identified using 2 identifiers.  Patient Location: Home Provider Location: Office/Clinic  ID:  Abir Craine, DOB December 26, 1955, MRN [id number]  PCP:  Worthy Keeler, MD  Cardiologist:  Nanetta Batty, MD Sleep Medicine:  Armanda Magic, MD Electrophysiologist:  None   Chief Complaint:  OSA  History of Present Illness:    Robert [name] is a 66 y.o. male  with a history of PAF who was referred for sleep study due to paroxysmal atrial fibrillation. He was initially dx with very mild OSA with an AHI of 7.3 events per hour.  Most events occurred during NREM sleep in the supine position.  He was referred to Dr. Lazarus Salines for ENT evaluation and nothing was recommended.  He was then referred to Dr. Toni Arthurs for fitting of oral device.  He was seen by Boyce Medici on 11/03/2016 and was noncompliant with his oral device due to intolerance from poor fit and jaw pain.  He was instructed to follow-up with Dr. Toni Arthurs.  He later decided that he could no longer use the oral device because of severe jaw pain.  He underwent home sleep study which showed moderate obstructive sleep apnea with an AHI of 22.7/h with oxygen saturations as low as 81%.  He subsequently underwent  CPAP titration to 7 cm of water pressure but was placed on auto CPAP.   He is doing well with his PAP device and thinks that he has gotten used to it.  He tolerates the nasal mask and feels the pressure is adequate.  Since going on PAP he feels rested in the am and has no significant daytime sleepiness.  He denies any significant nasal dryness or nasal congestion.  He occasionally will have a dry mouth during the night. He does not think that he snores.  He says that since his first afib event he has not had any events since then. He denies any palpitations in the past year.  Prior CV studies:   The following studies were reviewed today:  PAP compliance download  Past Medical History:  Diagnosis Date   OSA (obstructive sleep apnea) 09/16/2014   PAF (paroxysmal atrial fibrillation) (HCC)    Past Surgical History:  Procedure Laterality Date   HERNIA REPAIR     NASAL SEPTUM SURGERY       Current Meds  Medication Sig   aspirin 81 MG tablet Take 81 mg by mouth daily.   Coenzyme Q10 100 MG capsule Take 100 mg by mouth daily.   diltiazem (CARDIZEM CD) 120 MG 24 hr capsule TAKE 1 CAPSULE BY MOUTH EVERY DAY   finasteride (PROSCAR) 5 MG tablet Take 5 mg by mouth daily.   flecainide (TAMBOCOR) 100 MG tablet Take 2 tablets (200 mg total) by mouth as needed (  only 1 time dose, and only if you go into atrial fibrillation/palpitations).   rosuvastatin (CRESTOR) 5 MG tablet Take 5 mg by mouth at bedtime.   tadalafil (CIALIS) 5 MG tablet Take 5 mg by mouth daily.   valACYclovir (VALTREX) 500 MG tablet Take 500 mg by mouth daily as needed (use as directed.). For break outs     Allergies:   Patient has no known allergies.   Social History   Tobacco Use   Smoking status: Never   Smokeless tobacco: Never  Vaping Use   Vaping Use: Never used  Substance Use Topics   Alcohol use: Yes    Comment: occasionally   Drug use: No     Family Hx: The patient's family history includes Atrial fibrillation  in his mother; Kidney disease in his father; Lung cancer in his father.  ROS:   Please see the history of present illness.     All other systems reviewed and are negative.   Labs/Other Tests and Data Reviewed:    Recent Labs: No results found for requested labs within last 365 days.   Recent Lipid Panel Lab Results  Component Value Date/Time   CHOL 167 06/13/2016 11:02 AM   TRIG 49 06/13/2016 11:02 AM   HDL 63 06/13/2016 11:02 AM   CHOLHDL 2.7 06/13/2016 11:02 AM   LDLCALC 94 06/13/2016 11:02 AM    Wt Readings from Last 3 Encounters:  03/03/22 160 lb (72.6 kg)  10/29/21 159 lb 9.6 oz (72.4 kg)  03/05/21 162 lb (73.5 kg)     Objective:    Vital Signs:  BP 124/69   Pulse (!) 45   Ht 5\' 9"  (1.753 m)   Wt 160 lb (72.6 kg)   SpO2 99%   BMI 23.63 kg/m    Well nourished, well developed male in no acute distress. Well appearing, alert and conversant, regular work of breathing,  good skin color  Eyes- anicteric mouth- oral mucosa is pink  neuro- grossly intact skin- no apparent rash or lesions or cyanosis ASSESSMENT & PLAN:    1. OSA - The patient is tolerating PAP therapy well without any problems. The PAP download performed by his DME was personally reviewed and interpreted by me today and showed an AHI of 2/hr on 12/5 cm H2O with 97% compliance in using more than 4 hours nightly.  The patient has been using and benefiting from PAP use and will continue to benefit from therapy.    2.  PAF -he denies any palpitations -continue prescription drug management with Flecainide PRN and Cardizem XT 120mg  daily with PRN refills -no anticoagulation due to CHADS2VASC score of 1 (age>65)   Time:   Today, I have spent 15 minutes with the patient with telehealth technology discussing the above problems.    Medication Adjustments/Labs and Tests Ordered: Current medicines are reviewed at length with the patient today.  Concerns regarding medicines are outlined above.  Tests  Ordered: No orders of the defined types were placed in this encounter.   Medication Changes: No orders of the defined types were placed in this encounter.    Disposition:  Follow up in 1 year(s)  Signed, 14/5, MD  03/03/2022 8:10 AM     [facility]

## 2022-03-03 NOTE — Patient Instructions (Signed)
Medication Instructions:  Your physician recommends that you continue on your current medications as directed. Please refer to the Current Medication list given to you today.  *If you need a refill on your cardiac medications before your next appointment, please call your pharmacy*   Follow-Up: At [facility], you and your health needs are our priority.  As part of our continuing mission to provide you with exceptional heart care, we have created designated Provider Care Teams.  These Care Teams include your primary Cardiologist (physician) and Advanced Practice Providers (APPs -  Physician Assistants and Nurse Practitioners) who all work together to provide you with the care you need, when you need it.   Your next appointment:   1 year(s)  The format for your next appointment:   In Person  Provider:   Dr. Turner   Important Information About Sugar       

## 2022-11-09 ENCOUNTER — Telehealth: Payer: Self-pay | Admitting: Emergency Medicine

## 2022-11-09 NOTE — Telephone Encounter (Signed)
[facility] is calling to get information on patient as soon as possible since he is there for a procedure. Caller, Chloe, states that patient had tests run prior to procedure and they have further questions after being resulted. Patient informed them that Dr. Allyson Sabal told patient to call if there was any issues with results. Requesting call back to discuss further.   Chloe stated that if you can not reach her on # [phone]) to please reach out to the number below and ask for her.   [phone]

## 2022-11-09 NOTE — Telephone Encounter (Signed)
Patient is at hospital for procedure.  Cystoscopy procedure.  For  Pre-op EKG and Echo needed. These were obtained while he was there. 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  Urologist was consulted and patient placed on telemetry.  She goes on about a lot of information and have to get her to tell me what exactly she needs.  Still unable to get a straight answer. Basic asking for recent EKG or echo.  Advised last EKG 2023 and echo from 2021 which she states they have.  She states the procedure was done and they again just want recent information.  Advice would send this to then.  Also advised to send the information they have , to our office so it can be reviewed at next appt.

## 2022-11-15 ENCOUNTER — Ambulatory Visit: Payer: Medicare Other | Admitting: Cardiovascular Disease

## 2023-02-08 ENCOUNTER — Encounter: Payer: Self-pay | Admitting: Cardiovascular Disease

## 2023-02-08 ENCOUNTER — Ambulatory Visit: Payer: Medicare Other | Attending: Cardiovascular Disease | Admitting: Cardiovascular Disease

## 2023-02-08 VITALS — BP 112/68 | HR 52 | Ht 69.0 in | Wt 164.0 lb

## 2023-02-08 DIAGNOSIS — G4733 Obstructive sleep apnea (adult) (pediatric): Secondary | ICD-10-CM | POA: Diagnosis present

## 2023-02-08 DIAGNOSIS — E782 Mixed hyperlipidemia: Secondary | ICD-10-CM | POA: Insufficient documentation

## 2023-02-08 DIAGNOSIS — I480 Paroxysmal atrial fibrillation: Secondary | ICD-10-CM | POA: Insufficient documentation

## 2023-02-08 DIAGNOSIS — R931 Abnormal findings on diagnostic imaging of heart and coronary circulation: Secondary | ICD-10-CM | POA: Insufficient documentation

## 2023-02-08 MED ORDER — DILTIAZEM HCL ER COATED BEADS 120 MG PO CP24: 120.0000 mg | ORAL_CAPSULE | Freq: Every day | ORAL | 3 refills | Status: DC

## 2023-02-08 NOTE — Patient Instructions (Signed)

## 2023-02-08 NOTE — Assessment & Plan Note (Signed)
History of obstructive sleep apnea on CPAP which she benefits from 

## 2023-02-08 NOTE — Assessment & Plan Note (Signed)
History of hyperlipidemia on low-dose statin therapy with lipid profile performed by his PCP 01/13/2023 revealing a total cholesterol 145, LDL 75 and HDL 61.

## 2023-02-08 NOTE — Progress Notes (Signed)
02/08/2023 Robert [name]   10-24-1955  [id number]  Primary Physician Worthy Keeler, MD Primary Cardiologist: Runell Gess MD [name]  HPI:  Robert [name] is a 67 y.o.  well-appearing Caucasian male who relocated from [place], [place] to [place] on 11/30/2013 to work at [employer]. He has retired since I last saw him and currently lives in [facility] [place]. I last saw him in the office 10/29/2021.  He has no cardiovascular risk factors. He was seen by Dr. Patty Sermons on 05/27/14 for PAF. A GXT was normal. An echo showed normal LV systolic function, grade 2 diastolic dysfunction, moderate right and mild left atrial enlargement. I last saw him in the office 08/04/15 His CHA2DSVASC2 score is  0 .He does get occasional episode of decreased exercise tolerance during long runs which he attributes to episodes of peak AF which have been documented in the past. He was not put on an oral anticoagulated because of his low score. He had a coronary calcium score performed last year which was 0. An event monitor showed isolated PVCs. He sees Dr. Mayford Knife for evaluation of obstructive sleep apnea which he apparently has and wears CPAP at night which benefits him.  He has limited caffeine from his diet. He has in the past limited his alcohol intake as well which has reduced his symptoms of palpitations.   He has retired and currently lives in [place]. He did have a Myoview stress test performed 05/03/17 that was entirely normal with EF of 60% done because of "an abnormal EKG".  Since he retired his palpitations have almost completely resolved.    He did have several episodes of tachypalpitations back in October or November of last year but none since.   Since I saw him in the office a year ago he continues to do well.  He is completely asymptomatic.  He has not had an A. fib recurrence since 2016.  He denies chest pain or shortness of breath.  His heart rate is in the 50s today.   He is running 25 miles a week and also rides his bike he was seen in the ER at [facility] for abdominal pain and found to have ureteral stone.  He did have a 2D echo performed that essentially normal.  Current Meds  Medication Sig   aspirin 81 MG tablet Take 81 mg by mouth daily.   Coenzyme Q10 100 MG capsule Take 100 mg by mouth daily.   diltiazem (CARDIZEM CD) 120 MG 24 hr capsule TAKE 1 CAPSULE BY MOUTH EVERY DAY   finasteride (PROSCAR) 5 MG tablet Take 5 mg by mouth daily.   flecainide (TAMBOCOR) 100 MG tablet Take 2 tablets (200 mg total) by mouth as needed (only 1 time dose, and only if you go into atrial fibrillation/palpitations).   rosuvastatin (CRESTOR) 5 MG tablet Take 5 mg by mouth at bedtime.   valACYclovir (VALTREX) 500 MG tablet Take 500 mg by mouth daily as needed (use as directed.). For break outs     No Known Allergies  Social History   Socioeconomic History   Marital status: Single    Spouse name: Not on file   Number of children: Not on file   Years of education: Not on file   Highest education level: Not on file  Occupational History   Not on file  Tobacco Use   Smoking status: Never   Smokeless tobacco: Never  Vaping Use  Vaping status: Never Used  Substance and Sexual Activity   Alcohol use: Yes    Comment: occasionally   Drug use: No   Sexual activity: Yes  Other Topics Concern   Not on file  Social History Narrative   Not on file   Social Determinants of Health   Financial Resource Strain: Low Risk  (11/09/2022)   [facility]   Overall Financial Resource Strain (CARDIA)    Difficulty of Paying Living Expenses: Not hard at all  Food Insecurity: No Food Insecurity (11/09/2022)   Received from [facility]   Hunger Vital Sign    Worried About Running Out of Food in the Last Year: Never true    Ran Out of Food in the Last Year: Never true  Transportation Needs: No Transportation Needs (11/09/2022)   Received from [facility] - Transportation    Lack of Transportation (Medical): No    Lack of Transportation (Non-Medical): No  Physical Activity: Sufficiently Active (11/09/2022)   Received from [facility]   Exercise Vital Sign    Days of Exercise per Week: 5 days    Minutes of Exercise per Session: 150+ min  Stress: No Stress Concern Present (11/09/2022)   Received from [facility] of Occupational Health - Occupational Stress Questionnaire    Feeling of Stress : Not at all  Social Connections: Socially Integrated (11/09/2022)   Received from [facility]   Social Connection and Isolation Panel [NHANES]    Frequency of Communication with Friends and Family: More than three times a week    Frequency of Social Gatherings with Friends and Family: More than three times a week    Attends Religious Services: More than 4 times per year    Active Member of [employer] or Organizations: Yes    Attends Engineer, structural: More than 4 times per year    Marital Status: Married  Catering manager Violence: Not At Risk (11/09/2022)   Received from [employer], Afraid, Rape, and Kick questionnaire    Fear of Current or Ex-Partner: No    Emotionally Abused: No    Physically Abused: No    Sexually Abused: No     Review of Systems: General: negative for chills, fever, night sweats or weight changes.  Cardiovascular: negative for chest pain, dyspnea on exertion, edema, orthopnea, palpitations, paroxysmal nocturnal dyspnea or shortness of breath Dermatological: negative for rash Respiratory: negative for cough or wheezing Urologic: negative for hematuria Abdominal: negative for nausea, vomiting, diarrhea, bright red blood per rectum, melena, or hematemesis Neurologic: negative for visual changes, syncope, or dizziness All other systems reviewed and are otherwise negative except as noted above.    Blood pressure 112/68, pulse (!) 52, height 5\' 9"  (1.753 m),  weight 164 lb (74.4 kg).  General appearance: alert and no distress Neck: no adenopathy, no carotid bruit, no JVD, supple, symmetrical, trachea midline, and thyroid not enlarged, symmetric, no tenderness/mass/nodules Lungs: clear to auscultation bilaterally Heart: regular rate and rhythm, S1, S2 normal, no murmur, click, rub or gallop Extremities: extremities normal, atraumatic, no cyanosis or edema Pulses: 2+ and symmetric Skin: Skin color, texture, turgor normal. No rashes or lesions Neurologic: Grossly normal  EKG EKG Interpretation Date/Time:  Wednesday February 08 2023 09:01:59 EDT Ventricular Rate:  51 PR Interval:  248 QRS Duration:  94 QT Interval:  436 QTC Calculation: 401 R Axis:   -37  Text Interpretation: Sinus bradycardia with 1st  degree A-V block Left axis deviation Incomplete right bundle branch block Minimal voltage criteria for LVH, may be normal variant ( R in aVL ) Nonspecific ST abnormality When compared with ECG of 24-May-2014 15:05, PREVIOUS ECG IS PRESENT Confirmed by Nanetta Batty [phone]) on 02/08/2023 9:17:37 AM    ASSESSMENT AND PLAN:   PAF (paroxysmal atrial fibrillation) (HCC) History of PAF without recurrence.  He is not on an oral anticoagulant because of low cardiac risk.  OSA (obstructive sleep apnea) History of obstructive sleep apnea on CPAP which she benefits from.  Hyperlipidemia History of hyperlipidemia on low-dose statin therapy with lipid profile performed by his PCP 01/13/2023 revealing a total cholesterol 145, LDL 75 and HDL 61.     Runell Gess MD FACP,FACC,FAHA, [facility] 02/08/2023 9:27 AM

## 2023-02-08 NOTE — Assessment & Plan Note (Signed)
History of PAF without recurrence.  He is not on an oral anticoagulant because of low cardiac risk.

## 2023-03-08 ENCOUNTER — Ambulatory Visit: Payer: Medicare Other | Attending: Cardiology | Admitting: Cardiology

## 2023-03-08 ENCOUNTER — Encounter: Payer: Self-pay | Admitting: Cardiology

## 2023-03-08 VITALS — BP 112/67 | HR 61 | Ht 69.0 in | Wt 160.0 lb

## 2023-03-08 DIAGNOSIS — G4733 Obstructive sleep apnea (adult) (pediatric): Secondary | ICD-10-CM | POA: Diagnosis present

## 2023-03-08 DIAGNOSIS — I480 Paroxysmal atrial fibrillation: Secondary | ICD-10-CM | POA: Diagnosis present

## 2023-03-08 NOTE — Progress Notes (Signed)
Virtual Visit via Video Note   This visit type was conducted due to national recommendations for restrictions regarding the COVID-19 Pandemic (e.g. social distancing) in an effort to limit this patient's exposure and mitigate transmission in our community.  Due to his co-morbid illnesses, this patient is at least at moderate risk for complications without adequate follow up.  This format is felt to be most appropriate for this patient at this time.  All issues noted in this document were discussed and addressed.  A limited physical exam was performed with this format.  Please refer to the patient's chart for his consent to telehealth for [facility]   Date:  03/08/2023   ID:  Robert [name], DOB 1956/02/02, MRN [id number] The patient was identified using 2 identifiers.  Patient Location: Home Provider Location: Office/Clinic  ID:  Bille Pallone, DOB 11-27-1955, MRN [id number]  PCP:  Worthy Keeler, MD  Cardiologist:  Nanetta Batty, MD Sleep Medicine:  Armanda Magic, MD Electrophysiologist:  None   Chief Complaint:  OSA  History of Present Illness:    Robert [name] is a 67 y.o. male  with a history of PAF who was referred for sleep study due to paroxysmal atrial fibrillation. He was initially dx with very mild OSA with an AHI of 7.3 events per hour.  Most events occurred during NREM sleep in the supine position.  He was referred to Dr. Lazarus Salines for ENT evaluation and nothing was recommended.  He was then referred to Dr. Toni Arthurs for fitting of oral device.  He was seen by Boyce Medici on 11/03/2016 and was noncompliant with his oral device due to intolerance from poor fit and jaw pain.  He was instructed to follow-up with Dr. Toni Arthurs.  He later decided that he could no longer use the oral device because of severe jaw pain.  He underwent home sleep study which showed moderate obstructive sleep apnea with an AHI of 22.7/h with oxygen saturations as low as 81%.  He subsequently underwent  CPAP titration to 7 cm of water pressure but was placed on auto CPAP.   He is doing well with his PAP device and thinks that he has gotten used to it.  He tolerates the nasal pillow mask and feels the pressure is adequate.  Since going on PAP he feels rested in the am and has no significant daytime sleepiness.  He denies any significant mouth or nasal dryness or nasal congestion.  He does not think that he snores.    He has not had any problems with afib since the first time he had afib.  He denies any palpitations.  He denies any chest pain or sob, dizziness or syncope.    Prior CV studies:   The following studies were reviewed today:  PAP compliance download  Past Medical History:  Diagnosis Date   OSA (obstructive sleep apnea) 09/16/2014   PAF (paroxysmal atrial fibrillation) (HCC)    Past Surgical History:  Procedure Laterality Date   HERNIA REPAIR     NASAL SEPTUM SURGERY       Current Meds  Medication Sig   aspirin 81 MG tablet Take 81 mg by mouth daily.   Coenzyme Q10 100 MG capsule Take 100 mg by mouth daily.   diltiazem (CARDIZEM CD) 120 MG 24 hr capsule Take 1 capsule (120 mg total) by mouth daily.   finasteride (PROSCAR) 5 MG tablet Take 5 mg by mouth daily.   rosuvastatin (CRESTOR) 5 MG tablet Take 5 mg by  mouth at bedtime.   tamsulosin (FLOMAX) 0.4 MG CAPS capsule Take 0.4 mg by mouth daily.   valACYclovir (VALTREX) 500 MG tablet Take 500 mg by mouth daily as needed (use as directed.). For break outs     Allergies:   Patient has no known allergies.   Social History   Tobacco Use   Smoking status: Never   Smokeless tobacco: Never  Vaping Use   Vaping status: Never Used  Substance Use Topics   Alcohol use: Yes    Comment: occasionally   Drug use: No     Family Hx: The patient's family history includes Atrial fibrillation in his mother; Kidney disease in his father; Lung cancer in his father.  ROS:   Please see the history of present illness.     All  other systems reviewed and are negative.   Labs/Other Tests and Data Reviewed:    Recent Labs: No results found for requested labs within last 365 days.   Recent Lipid Panel Lab Results  Component Value Date/Time   CHOL 167 06/13/2016 11:02 AM   TRIG 49 06/13/2016 11:02 AM   HDL 63 06/13/2016 11:02 AM   CHOLHDL 2.7 06/13/2016 11:02 AM   LDLCALC 94 06/13/2016 11:02 AM    Wt Readings from Last 3 Encounters:  03/08/23 160 lb (72.6 kg)  02/08/23 164 lb (74.4 kg)  03/03/22 160 lb (72.6 kg)     Objective:    Vital Signs:  BP 112/67   Pulse 61   Ht 5\' 9"  (1.753 m)   Wt 160 lb (72.6 kg)   BMI 23.63 kg/m    Well nourished, well developed male in no acute distress. Well appearing, alert and conversant, regular work of breathing,  good skin color  Eyes- anicteric mouth- oral mucosa is pink  neuro- grossly intact skin- no apparent rash or lesions or cyanosis ASSESSMENT & PLAN:    1. OSA - The patient is tolerating PAP therapy well without any problems. The PAP download performed by his DME was personally reviewed and interpreted by me today and showed an AHI of 1.5/hr on auto CPAP from 5-12 cm H2O with 97% compliance in using more than 4 hours nightly.  The patient has been using and benefiting from PAP use and will continue to benefit from therapy.     2.  PAF -he has not had any palpitations and does not think he has been in afib -continue prescription drug management with Flecainide 200mg  PRN for breakthrough palpitations, Cardizem CD 120mg  daily with PRN refills -no DOAC due to CHADS2VASC score of 1 (age>80)   Time:   Today, I have spent 15 minutes with the patient with telehealth technology discussing the above problems.    Medication Adjustments/Labs and Tests Ordered: Current medicines are reviewed at length with the patient today.  Concerns regarding medicines are outlined above.  Tests Ordered: No orders of the defined types were placed in this  encounter.   Medication Changes: No orders of the defined types were placed in this encounter.    Disposition:  Follow up in 1 year(s)  Signed, Armanda Magic, MD  03/08/2023 9:04 AM    [facility]

## 2023-03-08 NOTE — Patient Instructions (Signed)

## 2023-05-15 ENCOUNTER — Encounter: Payer: Self-pay | Admitting: Cardiovascular Disease

## 2023-12-14 NOTE — Discharge Summary (Signed)
 Date of Admission 12/13/2023  Date of Discharge 12/14/2023  Discharge Diagnosis Left ureteral calculus  Principal Problem:   Left ureteral calculus (POA: Unknown) Active Problems:   Primary hypertension (POA: Yes)   Gastroesophageal reflux disease without esophagitis (POA: Yes)   Bradycardia (POA: Yes)   Mixed hyperlipidemia (POA: Unknown)   BPH (benign prostatic hyperplasia) (POA: Unknown)   Discharge Medications Discharge Medication List as of 12/14/2023  8:19 AM     START taking these medications   Details  HYDROcodone-acetaminophen (Norco) 5-325 mg tablet Take 1 tablet by mouth every 6 (six) hours if needed for severe pain (8-10) for up to 5 days., Starting Thu 12/14/2023, Until Tue 12/19/2023 at 2359, Normal    phenazopyridine (Pyridium) 200 mg tablet Take 1 tablet (200 mg) by mouth if needed in the morning, at noon, and at bedtime for bladder spasms (dysuria) for up to 10 days., Starting Thu 12/14/2023, Until Sun 12/24/2023 at 2359, Normal       Discharge Medication List as of 12/14/2023  8:19 AM     CONTINUE these medications which have NOT CHANGED   Details  aspirin  81 mg EC tablet Take 81 mg by mouth 1 (one) time., Historical Med    coenzyme Q10-vitamin E 100-5 mg-unit capsule Take 100 mg by mouth in the morning., Historical Med    dilTIAZem  CD (Cardizem  CD) 120 mg 24 hr capsule Take 1 capsule by mouth in the morning., Starting Sun 05/29/2022, Historical Med    finasteride (Proscar) 5 mg tablet Take 1 tablet (5 mg) by mouth in the morning., Starting Mon 04/03/2023, Until Tue 04/02/2024, Normal    magnesium citrate (CITROMA ORAL) Historical Med    rosuvastatin (Crestor) 5 mg tablet Take 5 mg by mouth at bedtime., Historical Med    tamsulosin (Flomax) 0.4 mg 24 hr capsule Take 1 capsule (0.4 mg) by mouth at bedtime., Starting Mon 12/04/2023, Normal       Discharge Medication List as of 12/14/2023  8:19 AM      Hospital Course Patient admitted with left ureteral  stone. He passed the stone. Discharge to home. Instructed in outpatient follow up.  Pertinent Physical Exam At Time of Discharge Physical Exam General: Appears comfortable, in no acute distress. HEENT: Anicteric, oral mucosa moist, no lesions. Cardiac: Regular rate and rhythm. Respiratory: Lung sounds clear.  No wheezing or stridor noted. Abdomen: Soft, nontender, nondistended with active bowel sounds, no rebound, guarding or rigidity. Extremities: No clubbing or cyanosis.  No edema. Skin:  No skin breakdown or rash of non-dependent areas.  Neuro: Good orientation, recall. Psych: Good insight, appropriate affect. GU: voiding well, urine clear.  Outpatient Follow-Up Future Appointments  Date Time Provider Department Center  03/19/2024  8:00 AM [facility] CT 1 [facility]  04/02/2024  9:10 AM Sharl Jacobsen, NP [facility]          __________________________________________________________________ Marcey Schwalbe, MD [facility]

## 2024-02-05 ENCOUNTER — Encounter: Payer: Self-pay | Admitting: Cardiovascular Disease

## 2024-02-05 ENCOUNTER — Ambulatory Visit: Attending: Cardiovascular Disease | Admitting: Cardiovascular Disease

## 2024-02-05 VITALS — BP 118/78 | HR 48 | Ht 69.0 in | Wt 163.0 lb

## 2024-02-05 DIAGNOSIS — R931 Abnormal findings on diagnostic imaging of heart and coronary circulation: Secondary | ICD-10-CM | POA: Insufficient documentation

## 2024-02-05 DIAGNOSIS — E782 Mixed hyperlipidemia: Secondary | ICD-10-CM | POA: Diagnosis not present

## 2024-02-05 DIAGNOSIS — I480 Paroxysmal atrial fibrillation: Secondary | ICD-10-CM | POA: Insufficient documentation

## 2024-02-05 DIAGNOSIS — R001 Bradycardia, unspecified: Secondary | ICD-10-CM | POA: Insufficient documentation

## 2024-02-05 NOTE — Assessment & Plan Note (Signed)
 Coronary calcium score performed 12/18/2014 was 0.

## 2024-02-05 NOTE — Progress Notes (Signed)
 02/05/2024 Robert [name]   Oct 17, 1955  [id number]  Primary Physician Robert [name] HERO, MD Primary Cardiologist: Robert [name] Robert Lesches MD Robert [name], [place]  HPI:  Robert [name] is a 68 y.o.   well-appearing married Caucasian male father of 2 boys, grandfather of 3 grandsons, who relocated from [place], [place] to [place]  on 11/30/2013 to work at [employer]. He has retired since I last saw him and currently lives in [facility] . I last saw him in the office 02/08/2023.  He has no cardiovascular risk factors. He was seen by Dr. Dominick on 05/27/14 for PAF. A GXT was normal. An echo showed normal LV systolic function, grade 2 diastolic dysfunction, moderate right and mild left atrial enlargement. I last saw him in the office 08/04/15 His CHA2DSVASC2 score is  0 .He does get occasional episode of decreased exercise tolerance during long runs which he attributes to episodes of peak AF which have been documented in the past. He was not put on an oral anticoagulated because of his low score. He had a coronary calcium score performed last year which was 0. An event monitor showed isolated PVCs. He sees Dr. Shlomo for evaluation of obstructive sleep apnea which he apparently has and wears CPAP at night which benefits him.  He has limited caffeine from his diet. He has in the past limited his alcohol intake as well which has reduced his symptoms of palpitations.   He has retired and currently lives in [place]. He did have a Myoview  stress test performed 05/03/17 that was entirely normal with EF of 60% done because of an abnormal EKG.  Since he retired his palpitations have almost completely resolved.    He did have several episodes of tachypalpitations back in October or November of last year but none since.   Since I saw him in the office a year ago he continues to do well.  He is completely asymptomatic.  He has not had an A. fib recurrence since 2016.  He denies chest pain or  shortness of breath.  His heart rate is in the 50s today.  He is running 25-30 miles a week and also rides his bike.  He had a kidney stone this past April which passed spontaneously.     Current Meds  Medication Sig   aspirin  81 MG tablet Take 81 mg by mouth daily.   Coenzyme Q10 100 MG capsule Take 100 mg by mouth daily.   diltiazem  (CARDIZEM  CD) 120 MG 24 hr capsule Take 1 capsule (120 mg total) by mouth daily.   finasteride (PROSCAR) 5 MG tablet Take 5 mg by mouth daily.   rosuvastatin (CRESTOR) 5 MG tablet Take 5 mg by mouth at bedtime.   tamsulosin (FLOMAX) 0.4 MG CAPS capsule Take 0.4 mg by mouth daily.   valACYclovir (VALTREX) 500 MG tablet Take 500 mg by mouth daily as needed (use as directed.). For break outs     No Known Allergies  Social History   Socioeconomic History   Marital status: Single    Spouse name: Not on file   Number of children: Not on file   Years of education: Not on file   Highest education level: Not on file  Occupational History   Not on file  Tobacco Use   Smoking status: Never   Smokeless tobacco: Never  Vaping Use   Vaping status: Never Used  Substance and Sexual Activity   Alcohol use: Yes  Comment: occasionally   Drug use: No   Sexual activity: Yes  Other Topics Concern   Not on file  Social History Narrative   Not on file   Social Drivers of Health   Financial Resource Strain: Low Risk (12/13/2023)   Received from [facility]   Overall Financial Resource Strain (CARDIA)    Difficulty of Paying Living Expenses: Not hard at all  Food Insecurity: No Food Insecurity (12/13/2023)   Received from [facility]   Hunger Vital Sign    Within the past 12 months, you worried that your food would run out before you got the money to buy more.: Never true    Within the past 12 months, the food you bought just didn't last and you didn't have money to get more.: Never true  Transportation Needs: No Transportation Needs (12/13/2023)    Received from [facility] - Transportation    Lack of Transportation (Medical): No    Lack of Transportation (Non-Medical): No  Physical Activity: Sufficiently Active (12/13/2023)   Received from [facility]   Exercise Vital Sign    On average, how many days per week do you engage in moderate to strenuous exercise (like a brisk walk)?: 5 days    On average, how many minutes do you engage in exercise at this level?: 150+ min  Stress: No Stress Concern Present (12/13/2023)   Received from [facility] - Occupational Stress Questionnaire    Feeling of Stress : Not at all  Social Connections: Socially Integrated (12/13/2023)   Received from [facility]   Social Connection and Isolation Panel    In a typical week, how many times do you talk on the phone with family, friends, or neighbors?: More than three times a week    How often do you get together with friends or relatives?: More than three times a week    How often do you attend church or religious services?: More than 4 times per year    Do you belong to any clubs or organizations such as church groups, unions, fraternal or athletic groups, or school groups?: Yes    How often do you attend meetings of the clubs or organizations you belong to?: More than 4 times per year    Are you married, widowed, divorced, separated, never married, or living with a partner?: Married  Intimate Partner Violence: Not At Risk (12/13/2023)   Received from [facility]   Humiliation, Afraid, Rape, and Kick questionnaire    Within the last year, have you been afraid of your partner or ex-partner?: No    Within the last year, have you been humiliated or emotionally abused in other ways by your partner or ex-partner?: No    Within the last year, have you been kicked, hit, slapped, or otherwise physically hurt by your partner or ex-partner?: No    Within the last year, have you been raped or forced to have  any kind of sexual activity by your partner or ex-partner?: No     Review of Systems: General: negative for chills, fever, night sweats or weight changes.  Cardiovascular: negative for chest pain, dyspnea on exertion, edema, orthopnea, palpitations, paroxysmal nocturnal dyspnea or shortness of breath Dermatological: negative for rash Respiratory: negative for cough or wheezing Urologic: negative for hematuria Abdominal: negative for nausea, vomiting, diarrhea, bright red blood per rectum, melena, or hematemesis Neurologic: negative for visual changes, syncope, or dizziness All other systems  reviewed and are otherwise negative except as noted above.    Blood pressure 118/78, pulse (!) 48, height 5' 9 (1.753 m), weight 163 lb (73.9 kg), SpO2 96%.  General appearance: alert and no distress Neck: no adenopathy, no carotid bruit, no JVD, supple, symmetrical, trachea midline, and thyroid  not enlarged, symmetric, no tenderness/mass/nodules Lungs: clear to auscultation bilaterally Heart: regular rate and rhythm, S1, S2 normal, no murmur, click, rub or gallop Extremities: extremities normal, atraumatic, no cyanosis or edema Pulses: 2+ and symmetric Skin: Skin color, texture, turgor normal. No rashes or lesions Neurologic: Grossly normal  EKG EKG Interpretation Date/Time:  Monday February 05 2024 11:44:10 EDT Ventricular Rate:  48 PR Interval:  258 QRS Duration:  98 QT Interval:  464 QTC Calculation: 414 R Axis:   -32  Text Interpretation: Sinus bradycardia with 1st degree A-V block Left axis deviation Incomplete right bundle branch block Minimal voltage criteria for LVH, may be normal variant ( R in aVL ) Nonspecific ST abnormality When compared with ECG of 08-Feb-2023 09:01, No significant change was found Confirmed by Court Carrier [id number]) on 02/05/2024 11:59:06 AM    ASSESSMENT AND PLAN:   PAF (paroxysmal atrial fibrillation) (HCC) History of PAF currently in sinus rhythm.  He has  not had an episode since 2016.  Hyperlipidemia History of hyperlipidemia on low-dose Crestor followed by his PCP.  His LDL performed 01/13/2023 was 75.  Elevated coronary artery calcium score Coronary calcium score performed 12/18/2014 was 0.  Sinus bradycardia Chronic probably from being in shape.     Carrier DOROTHA Court MD FACP,FACC,FAHA, [facility] 02/05/2024 12:10 PM

## 2024-02-05 NOTE — Assessment & Plan Note (Signed)
 Chronic probably from being in shape.

## 2024-02-05 NOTE — Patient Instructions (Signed)

## 2024-02-05 NOTE — Assessment & Plan Note (Signed)
 History of PAF currently in sinus rhythm.  He has not had an episode since 2016.

## 2024-02-05 NOTE — Assessment & Plan Note (Signed)
 History of hyperlipidemia on low-dose Crestor followed by his PCP.  His LDL performed 01/13/2023 was 75.

## 2024-02-18 ENCOUNTER — Other Ambulatory Visit: Payer: Self-pay | Admitting: Cardiovascular Disease

## 2024-02-26 ENCOUNTER — Telehealth: Payer: Self-pay | Admitting: Cardiology

## 2024-02-26 NOTE — Telephone Encounter (Signed)
 Pt called and cx Video Visit for 11/19. He states his PCP is going to do the compliance ov. He said he called a few weeks a go asking about Medicare not cover the Video visit and someone was suppose to call him back, but he didn't hear from anyone.

## 2024-03-06 ENCOUNTER — Ambulatory Visit: Admitting: Cardiology
